# Patient Record
Sex: Female | Born: 1950 | Race: White | Hispanic: No | Marital: Single | Smoking: Never smoker
Health system: Southern US, Community
[De-identification: ages and names within clinical notes are randomized; demographics above are authoritative.]

## PROBLEM LIST (undated history)

## (undated) DIAGNOSIS — M1990 Unspecified osteoarthritis, unspecified site: Secondary | ICD-10-CM

## (undated) DIAGNOSIS — K219 Gastro-esophageal reflux disease without esophagitis: Secondary | ICD-10-CM

## (undated) DIAGNOSIS — I499 Cardiac arrhythmia, unspecified: Secondary | ICD-10-CM

## (undated) DIAGNOSIS — N3281 Overactive bladder: Secondary | ICD-10-CM

## (undated) DIAGNOSIS — I10 Essential (primary) hypertension: Secondary | ICD-10-CM

## (undated) DIAGNOSIS — E785 Hyperlipidemia, unspecified: Secondary | ICD-10-CM

## (undated) DIAGNOSIS — R42 Dizziness and giddiness: Secondary | ICD-10-CM

## (undated) HISTORY — DX: Overactive bladder: N32.81

---

## 2014-07-14 ENCOUNTER — Emergency Department: Payer: Self-pay | Admitting: Emergency Medicine

## 2014-07-14 LAB — BASIC METABOLIC PANEL
Anion Gap: 8 (ref 7–16)
BUN: 14 mg/dL (ref 7–18)
CHLORIDE: 108 mmol/L — AB (ref 98–107)
CO2: 24 mmol/L (ref 21–32)
Calcium, Total: 8.4 mg/dL — ABNORMAL LOW (ref 8.5–10.1)
Creatinine: 0.86 mg/dL (ref 0.60–1.30)
EGFR (African American): >60
GLUCOSE: 139 mg/dL — AB (ref 65–99)
Osmolality: 282 (ref 275–301)
POTASSIUM: 3.9 mmol/L (ref 3.5–5.1)
Sodium: 140 mmol/L (ref 136–145)

## 2014-07-14 LAB — CBC WITH DIFFERENTIAL/PLATELET
BASOS PCT: 0.8 %
Basophil #: 0 10*3/uL (ref 0.0–0.1)
Eosinophil #: 0.1 10*3/uL (ref 0.0–0.7)
Eosinophil %: 2.5 %
HCT: 39.6 % (ref 35.0–47.0)
HGB: 13 g/dL (ref 12.0–16.0)
Lymphocyte #: 1.1 10*3/uL (ref 1.0–3.6)
Lymphocyte %: 21.1 %
MCH: 30.5 pg (ref 26.0–34.0)
MCHC: 32.7 g/dL (ref 32.0–36.0)
MCV: 93 fL (ref 80–100)
MONO ABS: 0.3 x10 3/mm (ref 0.2–0.9)
MONOS PCT: 6.2 %
Neutrophil #: 3.5 10*3/uL (ref 1.4–6.5)
Neutrophil %: 69.4 %
Platelet: 178 10*3/uL (ref 150–440)
RBC: 4.25 10*6/uL (ref 3.80–5.20)
RDW: 13.1 % (ref 11.5–14.5)
WBC: 5 10*3/uL (ref 3.6–11.0)

## 2014-07-14 LAB — TROPONIN I: Troponin-I: <0.02 ng/mL

## 2014-07-14 LAB — URINALYSIS, COMPLETE
Bilirubin,UR: NEGATIVE
Blood: NEGATIVE
Glucose,UR: NEGATIVE mg/dL (ref 0–75)
Ketone: NEGATIVE
Nitrite: NEGATIVE
Ph: 5 (ref 4.5–8.0)
Protein: NEGATIVE
RBC,UR: 2 /HPF (ref 0–5)
SPECIFIC GRAVITY: 1.016 (ref 1.003–1.030)

## 2014-09-05 ENCOUNTER — Emergency Department: Payer: Self-pay | Admitting: Student

## 2014-09-05 LAB — HEPATIC FUNCTION PANEL A (ARMC)
ALT: 23 U/L
Albumin: 3.8 g/dL (ref 3.4–5.0)
Alkaline Phosphatase: 92 U/L
BILIRUBIN TOTAL: 0.4 mg/dL (ref 0.2–1.0)
Bilirubin, Direct: <0.1 mg/dL (ref 0.0–0.2)
SGOT(AST): 23 U/L (ref 15–37)
Total Protein: 7.3 g/dL (ref 6.4–8.2)

## 2014-09-05 LAB — BASIC METABOLIC PANEL
ANION GAP: 9 (ref 7–16)
BUN: 15 mg/dL (ref 7–18)
CALCIUM: 8.6 mg/dL (ref 8.5–10.1)
Chloride: 106 mmol/L (ref 98–107)
Co2: 25 mmol/L (ref 21–32)
Creatinine: 0.83 mg/dL (ref 0.60–1.30)
EGFR (African American): >60
Glucose: 103 mg/dL — ABNORMAL HIGH (ref 65–99)
OSMOLALITY: 280 (ref 275–301)
POTASSIUM: 4 mmol/L (ref 3.5–5.1)
Sodium: 140 mmol/L (ref 136–145)

## 2014-09-05 LAB — TROPONIN I: Troponin-I: <0.02 ng/mL

## 2014-09-05 LAB — CBC
HCT: 42 % (ref 35.0–47.0)
HGB: 14.6 g/dL (ref 12.0–16.0)
MCH: 31.6 pg (ref 26.0–34.0)
MCHC: 34.6 g/dL (ref 32.0–36.0)
MCV: 91 fL (ref 80–100)
Platelet: 206 10*3/uL (ref 150–440)
RBC: 4.61 10*6/uL (ref 3.80–5.20)
RDW: 12.9 % (ref 11.5–14.5)
WBC: 6.1 10*3/uL (ref 3.6–11.0)

## 2014-09-05 LAB — PRO B NATRIURETIC PEPTIDE: B-Type Natriuretic Peptide: 98 pg/mL (ref 0–125)

## 2014-10-04 DIAGNOSIS — H8110 Benign paroxysmal vertigo, unspecified ear: Secondary | ICD-10-CM | POA: Diagnosis not present

## 2014-10-05 DIAGNOSIS — M1711 Unilateral primary osteoarthritis, right knee: Secondary | ICD-10-CM | POA: Diagnosis not present

## 2014-10-11 DIAGNOSIS — G5752 Tarsal tunnel syndrome, left lower limb: Secondary | ICD-10-CM | POA: Diagnosis not present

## 2014-10-25 DIAGNOSIS — R5382 Chronic fatigue, unspecified: Secondary | ICD-10-CM | POA: Diagnosis not present

## 2014-10-25 DIAGNOSIS — R42 Dizziness and giddiness: Secondary | ICD-10-CM | POA: Diagnosis not present

## 2014-10-25 DIAGNOSIS — I10 Essential (primary) hypertension: Secondary | ICD-10-CM | POA: Diagnosis not present

## 2014-11-26 DIAGNOSIS — H9319 Tinnitus, unspecified ear: Secondary | ICD-10-CM | POA: Diagnosis not present

## 2014-11-26 DIAGNOSIS — H8109 Meniere's disease, unspecified ear: Secondary | ICD-10-CM | POA: Diagnosis not present

## 2014-12-10 DIAGNOSIS — H903 Sensorineural hearing loss, bilateral: Secondary | ICD-10-CM | POA: Diagnosis not present

## 2014-12-10 DIAGNOSIS — H8111 Benign paroxysmal vertigo, right ear: Secondary | ICD-10-CM | POA: Diagnosis not present

## 2014-12-13 DIAGNOSIS — H8111 Benign paroxysmal vertigo, right ear: Secondary | ICD-10-CM | POA: Diagnosis not present

## 2014-12-20 DIAGNOSIS — K120 Recurrent oral aphthae: Secondary | ICD-10-CM | POA: Diagnosis not present

## 2014-12-20 DIAGNOSIS — H8110 Benign paroxysmal vertigo, unspecified ear: Secondary | ICD-10-CM | POA: Diagnosis not present

## 2014-12-21 DIAGNOSIS — H8110 Benign paroxysmal vertigo, unspecified ear: Secondary | ICD-10-CM | POA: Diagnosis not present

## 2014-12-21 DIAGNOSIS — H9193 Unspecified hearing loss, bilateral: Secondary | ICD-10-CM | POA: Diagnosis not present

## 2014-12-21 DIAGNOSIS — H8109 Meniere's disease, unspecified ear: Secondary | ICD-10-CM | POA: Diagnosis not present

## 2014-12-30 DIAGNOSIS — H8111 Benign paroxysmal vertigo, right ear: Secondary | ICD-10-CM | POA: Diagnosis not present

## 2015-01-17 DIAGNOSIS — H8109 Meniere's disease, unspecified ear: Secondary | ICD-10-CM | POA: Diagnosis not present

## 2015-01-17 DIAGNOSIS — G44219 Episodic tension-type headache, not intractable: Secondary | ICD-10-CM | POA: Diagnosis not present

## 2015-01-17 DIAGNOSIS — H8110 Benign paroxysmal vertigo, unspecified ear: Secondary | ICD-10-CM | POA: Diagnosis not present

## 2015-02-20 ENCOUNTER — Emergency Department: Payer: No Typology Code available for payment source

## 2015-02-20 ENCOUNTER — Emergency Department
Admission: EM | Admit: 2015-02-20 | Discharge: 2015-02-20 | Disposition: A | Discharge status: Home / Self Care | Payer: No Typology Code available for payment source | Attending: Emergency Medicine | Admitting: Emergency Medicine

## 2015-02-20 ENCOUNTER — Encounter: Payer: Self-pay | Admitting: Emergency Medicine

## 2015-02-20 DIAGNOSIS — S299XXA Unspecified injury of thorax, initial encounter: Secondary | ICD-10-CM | POA: Diagnosis not present

## 2015-02-20 DIAGNOSIS — I10 Essential (primary) hypertension: Secondary | ICD-10-CM | POA: Insufficient documentation

## 2015-02-20 DIAGNOSIS — Y998 Other external cause status: Secondary | ICD-10-CM | POA: Insufficient documentation

## 2015-02-20 DIAGNOSIS — Y9389 Activity, other specified: Secondary | ICD-10-CM | POA: Diagnosis not present

## 2015-02-20 DIAGNOSIS — S3992XA Unspecified injury of lower back, initial encounter: Secondary | ICD-10-CM | POA: Insufficient documentation

## 2015-02-20 DIAGNOSIS — R079 Chest pain, unspecified: Secondary | ICD-10-CM | POA: Diagnosis not present

## 2015-02-20 DIAGNOSIS — R0789 Other chest pain: Secondary | ICD-10-CM | POA: Diagnosis not present

## 2015-02-20 DIAGNOSIS — Y92410 Unspecified street and highway as the place of occurrence of the external cause: Secondary | ICD-10-CM | POA: Diagnosis not present

## 2015-02-20 MED ORDER — TRAMADOL HCL 50 MG PO TABS: 50.0000 mg | ORAL_TABLET | Freq: Four times a day (QID) | ORAL | Status: DC | PRN

## 2015-02-20 MED ORDER — ORPHENADRINE CITRATE ER 100 MG PO TB12: 100.0000 mg | ORAL_TABLET | Freq: Two times a day (BID) | ORAL | Status: DC | PRN
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
## 2015-02-20 NOTE — Discharge Instructions (Signed)

## 2015-02-23 DIAGNOSIS — R05 Cough: Secondary | ICD-10-CM | POA: Diagnosis not present

## 2015-03-16 DIAGNOSIS — R0781 Pleurodynia: Secondary | ICD-10-CM | POA: Diagnosis not present

## 2015-05-23 DIAGNOSIS — M1712 Unilateral primary osteoarthritis, left knee: Secondary | ICD-10-CM | POA: Diagnosis not present

## 2015-05-23 DIAGNOSIS — R05 Cough: Secondary | ICD-10-CM | POA: Diagnosis not present

## 2015-05-23 DIAGNOSIS — K122 Cellulitis and abscess of mouth: Secondary | ICD-10-CM | POA: Diagnosis not present

## 2015-06-22 ENCOUNTER — Other Ambulatory Visit: Payer: Self-pay | Admitting: Family Medicine

## 2015-06-22 DIAGNOSIS — I10 Essential (primary) hypertension: Secondary | ICD-10-CM | POA: Diagnosis not present

## 2015-06-22 DIAGNOSIS — Z1231 Encounter for screening mammogram for malignant neoplasm of breast: Secondary | ICD-10-CM | POA: Diagnosis not present

## 2015-06-22 DIAGNOSIS — R05 Cough: Secondary | ICD-10-CM | POA: Diagnosis not present

## 2015-06-27 DIAGNOSIS — R05 Cough: Secondary | ICD-10-CM | POA: Diagnosis not present

## 2015-06-30 ENCOUNTER — Ambulatory Visit
Admission: RE | Admit: 2015-06-30 | Discharge: 2015-06-30 | Disposition: A | Discharge status: Home / Self Care | Payer: Commercial Managed Care - HMO | Source: Ambulatory Visit | Attending: Family Medicine | Admitting: Family Medicine

## 2015-06-30 DIAGNOSIS — Z1231 Encounter for screening mammogram for malignant neoplasm of breast: Secondary | ICD-10-CM | POA: Diagnosis not present

## 2015-08-31 DIAGNOSIS — J0100 Acute maxillary sinusitis, unspecified: Secondary | ICD-10-CM | POA: Diagnosis not present

## 2015-12-21 DIAGNOSIS — M7752 Other enthesopathy of left foot: Secondary | ICD-10-CM | POA: Diagnosis not present

## 2015-12-21 DIAGNOSIS — N8110 Cystocele, unspecified: Secondary | ICD-10-CM | POA: Diagnosis not present

## 2015-12-21 DIAGNOSIS — I10 Essential (primary) hypertension: Secondary | ICD-10-CM | POA: Diagnosis not present

## 2015-12-28 DIAGNOSIS — M7662 Achilles tendinitis, left leg: Secondary | ICD-10-CM | POA: Diagnosis not present

## 2016-01-16 DIAGNOSIS — N8110 Cystocele, unspecified: Secondary | ICD-10-CM | POA: Diagnosis not present

## 2016-01-16 DIAGNOSIS — Z139 Encounter for screening, unspecified: Secondary | ICD-10-CM | POA: Diagnosis not present

## 2016-01-16 DIAGNOSIS — Z124 Encounter for screening for malignant neoplasm of cervix: Secondary | ICD-10-CM | POA: Diagnosis not present

## 2016-01-30 DIAGNOSIS — M7662 Achilles tendinitis, left leg: Secondary | ICD-10-CM | POA: Diagnosis not present

## 2016-03-05 DIAGNOSIS — N814 Uterovaginal prolapse, unspecified: Secondary | ICD-10-CM | POA: Diagnosis not present

## 2016-04-06 DIAGNOSIS — N814 Uterovaginal prolapse, unspecified: Secondary | ICD-10-CM | POA: Diagnosis not present

## 2016-06-27 DIAGNOSIS — I10 Essential (primary) hypertension: Secondary | ICD-10-CM | POA: Diagnosis not present

## 2016-06-27 DIAGNOSIS — Z1159 Encounter for screening for other viral diseases: Secondary | ICD-10-CM | POA: Diagnosis not present

## 2016-06-27 DIAGNOSIS — N3281 Overactive bladder: Secondary | ICD-10-CM | POA: Diagnosis not present

## 2016-06-27 DIAGNOSIS — Z0000 Encounter for general adult medical examination without abnormal findings: Secondary | ICD-10-CM | POA: Diagnosis not present

## 2016-06-27 DIAGNOSIS — M159 Polyosteoarthritis, unspecified: Secondary | ICD-10-CM | POA: Diagnosis not present

## 2016-07-30 DIAGNOSIS — R42 Dizziness and giddiness: Secondary | ICD-10-CM | POA: Diagnosis not present

## 2016-07-30 DIAGNOSIS — R432 Parageusia: Secondary | ICD-10-CM | POA: Diagnosis not present

## 2016-08-03 DIAGNOSIS — F458 Other somatoform disorders: Secondary | ICD-10-CM | POA: Diagnosis not present

## 2016-08-17 ENCOUNTER — Ambulatory Visit: Payer: Self-pay

## 2016-08-17 ENCOUNTER — Ambulatory Visit (INDEPENDENT_AMBULATORY_CARE_PROVIDER_SITE_OTHER): Payer: Commercial Managed Care - HMO | Admitting: Podiatry

## 2016-08-17 ENCOUNTER — Ambulatory Visit (INDEPENDENT_AMBULATORY_CARE_PROVIDER_SITE_OTHER): Payer: Commercial Managed Care - HMO

## 2016-08-17 VITALS — BP 146/90 | HR 69 | Temp 98.2°F | Resp 16 | Ht 59.0 in | Wt 147.0 lb

## 2016-08-17 DIAGNOSIS — R52 Pain, unspecified: Secondary | ICD-10-CM | POA: Diagnosis not present

## 2016-08-17 DIAGNOSIS — M79671 Pain in right foot: Secondary | ICD-10-CM

## 2016-08-17 DIAGNOSIS — M7661 Achilles tendinitis, right leg: Secondary | ICD-10-CM

## 2016-08-17 DIAGNOSIS — M722 Plantar fascial fibromatosis: Secondary | ICD-10-CM | POA: Diagnosis not present

## 2016-08-17 MED ORDER — NONFORMULARY OR COMPOUNDED ITEM: 1.0000 g | Freq: Four times a day (QID) | 2 refills | Status: DC

## 2016-09-09 MED ORDER — BETAMETHASONE SOD PHOS & ACET 6 (3-3) MG/ML IJ SUSP: 3.0000 mg | Freq: Once | INTRAMUSCULAR | Status: AC

## 2016-09-09 NOTE — Progress Notes (Signed)
Subjective: Patient presents today for pain and tenderness in the right foot. Patient states the foot pain has been hurting for several weeks now. Patient states that it hurts in the mornings with the first steps out of bed. Patient states that she's been having this heel pain for the past 3 years. Walking makes the pain worse. Patient has been applying Aspercreme which she states does help a little bit. Patient presents today for further treatment and evaluation  Objective: Physical Exam General: The patient is alert and oriented x3 in no acute distress.  Dermatology: Skin is warm, dry and supple bilateral lower extremities. Negative for open lesions or macerations bilateral.   Vascular: Dorsalis Pedis and Posterior Tibial pulses palpable bilateral.  Capillary fill time is immediate to all digits.  Neurological: Epicritic and protective threshold intact bilateral.   Musculoskeletal: Tenderness to palpation at the medial calcaneal tubercale and through the insertion of the plantar fascia of the right foot. All other joints range of motion within normal limits bilateral. Strength 5/5 in all groups bilateral.   Pain on palpation also noted to the insertion of the Achilles tendon right foot. Negative for pain on forced plantar flexion. Achilles tendon appears to be intact.  Radiographic exam: Normal osseous mineralization. Joint spaces preserved. No fracture/dislocation/boney destruction. Calcaneal spur present with mild thickening of plantar fascia right. No other soft tissue abnormalities or radiopaque foreign bodies.   Assessment: 1. Plantar fasciitis right 2. Pain in right foot 3. Insertional Achilles tendinitis right  Plan of Care:  1. Patient evaluated. Xrays reviewed.   2. Injection of 0.5cc Celestone soluspan injected into the right heel at the insertion of the plantar fascia.  3. Instructed patient regarding therapies and modalities at home to alleviate symptoms.  4. Rx for  anti-inflammatory pain cream dispensed through [employer]. 6. Compression anklet dispensed with silicone padding of the posterior heel 7. Return to clinic in 4 weeks.

## 2016-09-14 ENCOUNTER — Ambulatory Visit: Payer: Self-pay | Admitting: Podiatry

## 2016-10-08 DIAGNOSIS — K219 Gastro-esophageal reflux disease without esophagitis: Secondary | ICD-10-CM | POA: Diagnosis not present

## 2016-10-08 DIAGNOSIS — F458 Other somatoform disorders: Secondary | ICD-10-CM | POA: Diagnosis not present

## 2016-10-26 DIAGNOSIS — M1711 Unilateral primary osteoarthritis, right knee: Secondary | ICD-10-CM | POA: Diagnosis not present

## 2016-11-13 DIAGNOSIS — R6889 Other general symptoms and signs: Secondary | ICD-10-CM | POA: Diagnosis not present

## 2017-01-25 DIAGNOSIS — E7800 Pure hypercholesterolemia, unspecified: Secondary | ICD-10-CM | POA: Diagnosis not present

## 2017-01-25 DIAGNOSIS — M159 Polyosteoarthritis, unspecified: Secondary | ICD-10-CM | POA: Diagnosis not present

## 2017-01-25 DIAGNOSIS — N3281 Overactive bladder: Secondary | ICD-10-CM | POA: Diagnosis not present

## 2017-01-25 DIAGNOSIS — K219 Gastro-esophageal reflux disease without esophagitis: Secondary | ICD-10-CM | POA: Diagnosis not present

## 2017-01-25 DIAGNOSIS — Z79891 Long term (current) use of opiate analgesic: Secondary | ICD-10-CM | POA: Diagnosis not present

## 2017-01-25 DIAGNOSIS — I10 Essential (primary) hypertension: Secondary | ICD-10-CM | POA: Diagnosis not present

## 2017-02-15 ENCOUNTER — Ambulatory Visit: Payer: Self-pay | Admitting: Urology

## 2017-03-22 ENCOUNTER — Encounter: Payer: Self-pay | Admitting: Urology

## 2017-03-22 ENCOUNTER — Ambulatory Visit (INDEPENDENT_AMBULATORY_CARE_PROVIDER_SITE_OTHER): Payer: Medicare HMO | Admitting: Urology

## 2017-03-22 VITALS — BP 147/87 | HR 70 | Ht 59.0 in | Wt 147.0 lb

## 2017-03-22 DIAGNOSIS — N3281 Overactive bladder: Secondary | ICD-10-CM

## 2017-03-22 LAB — BLADDER SCAN AMB NON-IMAGING: SCAN RESULT: 37

## 2017-03-22 NOTE — Progress Notes (Signed)
Patient left without wanting to see provider. States she was "referred for the wrong damn thing, she needs a pacy". States she is going back to her doctor.

## 2017-03-22 NOTE — Progress Notes (Signed)
    03/22/2017 8:25 AM   Kaitlin Wilson 01-21-1951 [id number]  Referring provider: Sherrin Daisy, MD No address on file  No chief complaint on file.   HPI: Patient is a 66 -year-old Caucasian female who is referred to [place] by, Dr. Ellison Hughs, for urinary incontinence.  Patient left without being seen by me.   These notes generated with voice recognition software. I apologize for typographical errors.  [name], [facility] [address], [place], [place] [address] [phone]

## 2017-04-16 DIAGNOSIS — R351 Nocturia: Secondary | ICD-10-CM | POA: Diagnosis not present

## 2017-04-16 DIAGNOSIS — N816 Rectocele: Secondary | ICD-10-CM | POA: Diagnosis not present

## 2017-04-16 DIAGNOSIS — N3281 Overactive bladder: Secondary | ICD-10-CM | POA: Diagnosis not present

## 2017-04-16 DIAGNOSIS — N813 Complete uterovaginal prolapse: Secondary | ICD-10-CM | POA: Diagnosis not present

## 2017-04-17 DIAGNOSIS — J029 Acute pharyngitis, unspecified: Secondary | ICD-10-CM | POA: Diagnosis not present

## 2017-05-09 DIAGNOSIS — N814 Uterovaginal prolapse, unspecified: Secondary | ICD-10-CM | POA: Diagnosis not present

## 2017-05-12 ENCOUNTER — Ambulatory Visit
Admission: EM | Admit: 2017-05-12 | Discharge: 2017-05-12 | Disposition: A | Discharge status: Home / Self Care | Payer: Worker's Compensation | Attending: Family Medicine | Admitting: Family Medicine

## 2017-05-12 ENCOUNTER — Encounter: Payer: Self-pay | Admitting: Gynecology

## 2017-05-12 ENCOUNTER — Ambulatory Visit (INDEPENDENT_AMBULATORY_CARE_PROVIDER_SITE_OTHER): Payer: Worker's Compensation

## 2017-05-12 DIAGNOSIS — W19XXXA Unspecified fall, initial encounter: Secondary | ICD-10-CM | POA: Diagnosis not present

## 2017-05-12 DIAGNOSIS — M25532 Pain in left wrist: Secondary | ICD-10-CM | POA: Diagnosis not present

## 2017-05-12 DIAGNOSIS — M79642 Pain in left hand: Secondary | ICD-10-CM

## 2017-05-12 HISTORY — DX: Gastro-esophageal reflux disease without esophagitis: K21.9

## 2017-05-12 HISTORY — DX: Essential (primary) hypertension: I10

## 2017-05-12 NOTE — ED Provider Notes (Signed)
[facility]    CSN: [id number] Arrival date & time: 05/12/17  0808  History   Chief Complaint Chief Complaint  Patient presents with  . Hand Pain   HPI  66 year old female presents with complaints of hand/wrist pain.  Patient states that yesterday she was at work and tripped on some cords. She states that she fell forward onto an outstretched left hand. She immediately had pain and swelling of the hand and more predominantly wrist. She reports that she has continued to have significant pain and swelling. Decreased range of motion of the wrist. Pain is currently 10/10 in severity. She took 1 tramadol with no improvement. She's also used ice and heat with some improvement. Worse with palpation and range of motion. No reports of numbness or tingling. No other associated symptoms. No other complaints this time.  Past Medical History:  Diagnosis Date  . GERD (gastroesophageal reflux disease)   . Hypertension    Patient Active Problem List   Diagnosis Date Noted  . Hypertension 02/20/2015   History reviewed. No pertinent surgical history.  OB History    No data available     Home Medications    Prior to Admission medications   Medication Sig Start Date End Date Taking? Authorizing Provider  Cholecalciferol (VITAMIN D) 2000 units tablet Take by mouth.   Yes [provider]  losartan (COZAAR) 25 MG tablet TAKE 1 TABLET (25 MG TOTAL) BY MOUTH ONCE DAILY. 12/23/15  Yes [provider]  meloxicam (MOBIC) 7.5 MG tablet  01/12/16  Yes [provider]  NONFORMULARY OR COMPOUNDED ITEM Apply 1-2 g topically 4 (four) times daily. 08/17/16  Yes Edrick Kins, DPM  omeprazole (PRILOSEC) 20 MG capsule Take by mouth. 10/15/16  Yes [provider]  traMADol (ULTRAM) 50 MG tablet Take 1 tablet (50 mg total) by mouth every 6 (six) hours as needed. 02/20/15  Yes Triplett, Dessa Phi, FNP   Family History Family History  Problem Relation Age of Onset  .  Stroke Father   . Kidney cancer Neg Hx   . Bladder Cancer Neg Hx    Social History Social History  Substance Use Topics  . Smoking status: Never Smoker  . Smokeless tobacco: Never Used  . Alcohol use No   Allergies   Patient has no known allergies.   Review of Systems Review of Systems  Musculoskeletal:       Wrist/hand pain.  All other systems reviewed and are negative.  Physical Exam Triage Vital Signs ED Triage Vitals  Enc Vitals Group     BP 05/12/17 0827 (!) 149/72     Pulse Rate 05/12/17 0827 70     Resp 05/12/17 0827 16     Temp 05/12/17 0827 97.9 F (36.6 C)     Temp Source 05/12/17 0827 Oral     SpO2 05/12/17 0827 100 %     Weight 05/12/17 0828 145 lb (65.8 kg)     Height 05/12/17 0828 4\' 11"  (1.499 m)     Head Circumference --      Peak Flow --      Pain Score 05/12/17 0828 10     Pain Loc --      Pain Edu? --      Excl. in [place]? --    Updated Vital Signs BP (!) 149/72 (BP Location: Right Arm)   Pulse 70   Temp 97.9 F (36.6 C) (Oral)   Resp 16   Ht 4\' 11"  (1.499  m)   Wt 145 lb (65.8 kg)   SpO2 100%   BMI 29.29 kg/m   Physical Exam  Constitutional: She is oriented to person, place, and time. She appears well-developed. No distress.  HENT:  Head: Normocephalic and atraumatic.  Nose: Nose normal.  Eyes: Conjunctivae are normal. No scleral icterus.  Neck: Normal range of motion.  Cardiovascular: Normal rate and regular rhythm.   No murmur heard. Pulmonary/Chest: Effort normal and breath sounds normal.  Abdominal: Soft. She exhibits no distension. There is no tenderness.  Musculoskeletal:  Left hand and wrist - normal range of motion of the digits, nontender. No pain of the MCP joints. Patient has diffuse tenderness to palpation of the wrist. Has snuffbox tenderness as well. Decreased range of motion. Mild edema.  Neurological: She is alert and oriented to person, place, and time.  Skin: Skin is warm. No rash noted.  Psychiatric: She has a  normal mood and affect.  Vitals reviewed.  UC Treatments / Results  Labs (all labs ordered are listed, but only abnormal results are displayed) Labs Reviewed - No data to display  EKG  EKG Interpretation None       Radiology Dg Wrist Complete Left  Result Date: 05/12/2017 CLINICAL DATA:  Posterior LEFT wrist pain after falling on outstretched hand yesterday EXAM: LEFT WRIST - COMPLETE 3+ VIEW COMPARISON:  None FINDINGS: Diffuse osseous demineralization. Mild joint space narrowing at first [facility] joint. Joint spaces otherwise preserved. No acute fracture, dislocation, or bone destruction. Mild dorsal soft tissue swelling overlying carpus. IMPRESSION: Osseous demineralization with degenerative changes at first [facility] joint. No acute fracture or dislocation identified. Electronically Signed   By: Lavonia Dana M.D.   On: 05/12/2017 09:08   Procedures Procedures (including critical care time)  Medications Ordered in UC Medications - No data to display  Initial Impression / Assessment and Plan / UC Course  I have reviewed the triage vital signs and the nursing notes.  Pertinent labs & imaging results that were available during my care of the patient were reviewed by me and considered in my medical decision making (see chart for details).    66 year old female present with with complaints of left hand and wrist pain. X-ray negative for fracture. Advise continue use of her home meloxicam and tramadol. Work note provided.  Final Clinical Impressions(s) / UC Diagnoses   Final diagnoses:  Left wrist pain   New Prescriptions New Prescriptions   No medications on file   Controlled Substance Prescriptions [place] Controlled Substance Registry consulted? Not Applicable; Did not prescribe.   Thersa Salt Donnel[place], [place] 05/12/17 [phone]

## 2017-05-12 NOTE — ED Triage Notes (Signed)
Per patient fell at work x yesterday and injury left hand. Patient with pain and swollen left hand.

## 2017-05-12 NOTE — Discharge Instructions (Signed)
No fracture.  Use your Mobic and Tramadol as needed.   If pain persists and no improvement would recommend xray again.  Take care  Dr. Lacinda Axon

## 2017-05-14 DIAGNOSIS — N390 Urinary tract infection, site not specified: Secondary | ICD-10-CM | POA: Diagnosis not present

## 2017-05-14 DIAGNOSIS — R300 Dysuria: Secondary | ICD-10-CM | POA: Diagnosis not present

## 2017-05-20 ENCOUNTER — Ambulatory Visit: Payer: Commercial Managed Care - HMO | Admitting: Podiatry

## 2017-05-20 DIAGNOSIS — R0781 Pleurodynia: Secondary | ICD-10-CM | POA: Diagnosis not present

## 2017-05-20 DIAGNOSIS — R0789 Other chest pain: Secondary | ICD-10-CM | POA: Diagnosis not present

## 2017-05-31 ENCOUNTER — Ambulatory Visit (INDEPENDENT_AMBULATORY_CARE_PROVIDER_SITE_OTHER): Payer: Medicare HMO

## 2017-05-31 ENCOUNTER — Ambulatory Visit (INDEPENDENT_AMBULATORY_CARE_PROVIDER_SITE_OTHER): Payer: Medicare HMO | Admitting: Podiatry

## 2017-05-31 DIAGNOSIS — M659 Synovitis and tenosynovitis, unspecified: Secondary | ICD-10-CM

## 2017-05-31 DIAGNOSIS — M79672 Pain in left foot: Secondary | ICD-10-CM | POA: Diagnosis not present

## 2017-06-08 MED ORDER — BETAMETHASONE SOD PHOS & ACET 6 (3-3) MG/ML IJ SUSP: 3.0000 mg | Freq: Once | INTRAMUSCULAR | Status: DC

## 2017-06-08 NOTE — Progress Notes (Signed)
   Subjective:  66 year old female presents the office today for a new complaint regarding pain to the top of her left foot. This pain is been ongoing for the past month. Patient sustained an injury when she fell on top of her foot approximately one month ago and she is currently having shooting aching pain area aggravated by standing and walking. Alleviated by ice Epsom salt soaks and Aspercreme.  Objective / Physical Exam:  General:  The patient is alert and oriented x3 in no acute distress. Dermatology:  Skin is warm, dry and supple bilateral lower extremities. Negative for open lesions or macerations. Vascular:  Palpable pedal pulses bilaterally. No edema or erythema noted. Capillary refill within normal limits. Neurological:  Epicritic and protective threshold grossly intact bilaterally.  Musculoskeletal Exam:  Pain on palpation to the anterior lateral medial aspects of the patient's left ankle. Mild edema noted.  Range of motion within normal limits to all pedal and ankle joints bilateral. Muscle strength 5/5 in all groups bilateral.   Radiographic Exam:  Normal osseous mineralization. Joint spaces preserved. No fracture/dislocation/boney destruction.    Assessment: #1 pain in left ankle #2 synovitis of left ankle #3 capsulitis of left ankle  Plan of Care:  #1 Patient was evaluated. #2 injection of 0.5 mL Celestone Soluspan injected in the patient's left ankle. #3 the patient is currently taking Aleve. Continue taking Aleve when necessary pain #4 ankle brace dispensed #5 patient is to return to clinic in 4 weeks   Edrick Kins, DPM [facility]  Dr. Edrick Kins, [address]                                        [place], [place] [address]                Office [phone]  Fax [phone]

## 2017-06-12 DIAGNOSIS — N8111 Cystocele, midline: Secondary | ICD-10-CM | POA: Diagnosis not present

## 2017-06-12 DIAGNOSIS — N814 Uterovaginal prolapse, unspecified: Secondary | ICD-10-CM | POA: Diagnosis not present

## 2017-06-12 DIAGNOSIS — K5904 Chronic idiopathic constipation: Secondary | ICD-10-CM | POA: Diagnosis not present

## 2017-06-28 ENCOUNTER — Ambulatory Visit (INDEPENDENT_AMBULATORY_CARE_PROVIDER_SITE_OTHER): Payer: Medicare HMO | Admitting: Podiatry

## 2017-06-28 DIAGNOSIS — R600 Localized edema: Secondary | ICD-10-CM | POA: Diagnosis not present

## 2017-06-28 NOTE — Progress Notes (Signed)
   Subjective:  Patient presents today for follow-up evaluation and treatment of left ankle pain with synovitis. Patient actually has a new complaint today regarding the left lower extremity edema. She noticed that over the past few weeks she has developed a significant amount of swelling to her foot. Patient believes the injection she received last visit helped significantly.  Objective / Physical Exam:  General:  The patient is alert and oriented x3 in no acute distress. Dermatology:  Skin is warm, dry and supple bilateral lower extremities. Negative for open lesions or macerations. Vascular:  Palpable pedal pulses bilaterally. There is some minimal edema noted to the left foot.. Capillary refill within normal limits. Neurological:  Epicritic and protective threshold grossly intact bilaterally.  Musculoskeletal Exam:  Negative for Pain on palpation to the anterior lateral medial aspects of the patient's left ankle. Mild edema noted.  Range of motion within normal limits to all pedal and ankle joints bilateral. Muscle strength 5/5 in all groups bilateral.   Radiographic Exam:  Normal osseous mineralization. Joint spaces preserved. No fracture/dislocation/boney destruction.    Assessment: #1 pain in left ankle #2 left foot edema-mild  Plan of Care:  #1 Patient was evaluated. #2 today a compression ankle sleeve was dispensed for the patient. 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 Edrick Kins, [address]                                        [place], [place] [address]                Office [phone]  Fax [phone]

## 2017-07-14 ENCOUNTER — Encounter: Payer: Self-pay | Admitting: Emergency Medicine

## 2017-07-14 ENCOUNTER — Ambulatory Visit
Admission: EM | Admit: 2017-07-14 | Discharge: 2017-07-14 | Disposition: A | Discharge status: Home / Self Care | Payer: Medicare HMO

## 2017-07-14 DIAGNOSIS — R21 Rash and other nonspecific skin eruption: Secondary | ICD-10-CM

## 2017-07-14 DIAGNOSIS — L959 Vasculitis limited to the skin, unspecified: Secondary | ICD-10-CM | POA: Diagnosis not present

## 2017-07-14 NOTE — Discharge Instructions (Signed)
-  Would refrain from wearing the compression stocking any more. -Should improve on its own, if not better in about a week can follow up with dermatology  -you can take Advil or Tylenol for pain

## 2017-07-14 NOTE — ED Triage Notes (Signed)
Patient c/o red tingling rash on her left lower leg for the past 2 weeks.  Patient states that she wore a compression sock on her left leg 2 weeks ago.

## 2017-07-14 NOTE — ED Provider Notes (Signed)
[facility]    CSN: [id number] Arrival date & time: 07/14/17  1312     History   Chief Complaint Chief Complaint  Patient presents with  . Rash    HPI Kaitlin Wilson is a 66 y.o. female.   Patient is a 66 year old female who presents with complaint of rash to her lower left leg. Patient states she was seen by a podiatrist for ankle pain. She was initially seen August 31 given an injection and given a lace up ankle brace. She returned on September 28 and was noted to have ankle edema. The injection apparently helped with her pain. Patient states she was told that that she walks so much with work that she would need extra support for her ankle in the podiatrist gave her a slide on compression socking. Patient reports that the stocking is very tight and came up to just where the top of the rash is. Patient denies any itching or pain to the rash itself but does report some pain sensation upper leg.      Past Medical History:  Diagnosis Date  . GERD (gastroesophageal reflux disease)   . Hypertension     Patient Active Problem List   Diagnosis Date Noted  . Hypertension 02/20/2015    History reviewed. No pertinent surgical history.  OB History    No data available       Home Medications    Prior to Admission medications   Medication Sig Start Date End Date Taking? Authorizing Provider  Cholecalciferol (VITAMIN D) 2000 units tablet Take by mouth.    [provider]  losartan (COZAAR) 25 MG tablet TAKE 1 TABLET (25 MG TOTAL) BY MOUTH ONCE DAILY. 12/23/15   [provider]  NONFORMULARY OR COMPOUNDED ITEM Apply 1-2 g topically 4 (four) times daily. 08/17/16   Edrick Kins, DPM  omeprazole (PRILOSEC) 20 MG capsule Take by mouth. 10/15/16   [provider]    Family History Family History  Problem Relation Age of Onset  . Stroke Father   . Kidney cancer Neg Hx   . Bladder Cancer Neg Hx     Social History Social History    Substance Use Topics  . Smoking status: Never Smoker  . Smokeless tobacco: Never Used  . Alcohol use No     Allergies   Patient has no known allergies.   Review of Systems Review of Systems  As noted above in history of present illness. Other systems reviewed and found to be negative.   Physical Exam Triage Vital Signs ED Triage Vitals  Enc Vitals Group     BP 07/14/17 1420 (!) 155/73     Pulse Rate 07/14/17 1420 69     Resp 07/14/17 1420 16     Temp 07/14/17 1420 98.1 F (36.7 C)     Temp Source 07/14/17 1420 Oral     SpO2 07/14/17 1420 100 %     Weight 07/14/17 1418 140 lb (63.5 kg)     Height 07/14/17 1418 4\' 10"  (1.473 m)     Head Circumference --      Peak Flow --      Pain Score 07/14/17 1418 10     Pain Loc --      Pain Edu? --      Excl. in [place]? --    No data found.   Updated Vital Signs BP (!) 155/73 (BP Location: Left Arm)   Pulse 69   Temp 98.1 F (36.7  C) (Oral)   Resp 16   Ht 4\' 10"  (1.473 m)   Wt 140 lb (63.5 kg)   SpO2 100%   BMI 29.26 kg/m   Physical Exam  Constitutional: She is oriented to person, place, and time. She appears well-developed and well-nourished. No distress.  HENT:  Head: Normocephalic and atraumatic.  Eyes: Pupils are equal, round, and reactive to light.  Neck: Normal range of motion.  Cardiovascular: Normal rate.   Neurological: She is oriented to person, place, and time. No cranial nerve deficit.  Skin: Skin is warm. Capillary refill takes less than 2 seconds. Rash noted.     Petechial type rash to the left lower leg. Rash covers the anterior part of the leg is about 2.5 inches from top to bottom with a bandlike appearance. There is more confluence on the lower pole the rash but she can see more petechial type appearance and other areas. No pain to palpation. It is non-blanchable. Patient does have some varicose veins along the left edge of the rash and continuing above the rash.     UC Treatments / Results   Labs (all labs ordered are listed, but only abnormal results are displayed) Labs Reviewed - No data to display  EKG  EKG Interpretation None       Radiology No results found.  Procedures Procedures (including critical care time)  Medications Ordered in UC Medications - No data to display   Initial Impression / Assessment and Plan / UC Course  I have reviewed the triage vital signs and the nursing notes.  Pertinent labs & imaging results that were available during my care of the patient were reviewed by me and considered in my medical decision making (see chart for details).    Patient with a red non-blanchable rash that is in the same year where she had previously worn a tight compression sleeve that was given to her by a podiatrist. No pain to palpation. Bleeding most likely this is more of a reaction due to the tightness of the sleeve other than a reaction to the sleeve itself. Will have patient watch it and then follow with dermatology if needed.  Final Clinical Impressions(s) / UC Diagnoses   Final diagnoses:  Vasculitis of skin    New Prescriptions New Prescriptions   No medications on file     Controlled Substance Prescriptions [place] Controlled Substance Registry consulted? Not Applicable   Luvenia Redden, PA-C    Luvenia Redden, PA-C 07/14/17 [phone]

## 2017-08-05 DIAGNOSIS — N3281 Overactive bladder: Secondary | ICD-10-CM | POA: Diagnosis not present

## 2017-08-05 DIAGNOSIS — I10 Essential (primary) hypertension: Secondary | ICD-10-CM | POA: Diagnosis not present

## 2017-08-05 DIAGNOSIS — K219 Gastro-esophageal reflux disease without esophagitis: Secondary | ICD-10-CM | POA: Diagnosis not present

## 2017-08-05 DIAGNOSIS — E7800 Pure hypercholesterolemia, unspecified: Secondary | ICD-10-CM | POA: Diagnosis not present

## 2017-08-05 DIAGNOSIS — M159 Polyosteoarthritis, unspecified: Secondary | ICD-10-CM | POA: Diagnosis not present

## 2017-08-05 DIAGNOSIS — Z0000 Encounter for general adult medical examination without abnormal findings: Secondary | ICD-10-CM | POA: Diagnosis not present

## 2017-08-05 DIAGNOSIS — Z23 Encounter for immunization: Secondary | ICD-10-CM | POA: Diagnosis not present

## 2017-09-11 DIAGNOSIS — N814 Uterovaginal prolapse, unspecified: Secondary | ICD-10-CM | POA: Diagnosis not present

## 2017-10-04 DIAGNOSIS — R0789 Other chest pain: Secondary | ICD-10-CM | POA: Diagnosis not present

## 2017-10-04 DIAGNOSIS — K219 Gastro-esophageal reflux disease without esophagitis: Secondary | ICD-10-CM | POA: Diagnosis not present

## 2017-10-17 DIAGNOSIS — M79662 Pain in left lower leg: Secondary | ICD-10-CM | POA: Diagnosis not present

## 2017-10-17 DIAGNOSIS — R203 Hyperesthesia: Secondary | ICD-10-CM | POA: Diagnosis not present

## 2017-10-18 DIAGNOSIS — N8111 Cystocele, midline: Secondary | ICD-10-CM | POA: Diagnosis not present

## 2017-10-18 DIAGNOSIS — N814 Uterovaginal prolapse, unspecified: Secondary | ICD-10-CM | POA: Diagnosis not present

## 2017-11-05 DIAGNOSIS — N898 Other specified noninflammatory disorders of vagina: Secondary | ICD-10-CM | POA: Diagnosis not present

## 2017-11-05 DIAGNOSIS — B373 Candidiasis of vulva and vagina: Secondary | ICD-10-CM | POA: Diagnosis not present

## 2017-11-05 DIAGNOSIS — B9689 Other specified bacterial agents as the cause of diseases classified elsewhere: Secondary | ICD-10-CM | POA: Diagnosis not present

## 2017-11-05 DIAGNOSIS — Z4689 Encounter for fitting and adjustment of other specified devices: Secondary | ICD-10-CM | POA: Diagnosis not present

## 2017-11-05 DIAGNOSIS — N760 Acute vaginitis: Secondary | ICD-10-CM | POA: Diagnosis not present

## 2017-11-05 DIAGNOSIS — R300 Dysuria: Secondary | ICD-10-CM | POA: Diagnosis not present

## 2017-11-13 DIAGNOSIS — M79662 Pain in left lower leg: Secondary | ICD-10-CM | POA: Diagnosis not present

## 2017-11-13 DIAGNOSIS — M7552 Bursitis of left shoulder: Secondary | ICD-10-CM | POA: Diagnosis not present

## 2017-12-25 DIAGNOSIS — M25562 Pain in left knee: Secondary | ICD-10-CM | POA: Diagnosis not present

## 2017-12-25 DIAGNOSIS — S8002XA Contusion of left knee, initial encounter: Secondary | ICD-10-CM | POA: Diagnosis not present

## 2017-12-25 DIAGNOSIS — E669 Obesity, unspecified: Secondary | ICD-10-CM | POA: Diagnosis not present

## 2017-12-25 DIAGNOSIS — S8012XA Contusion of left lower leg, initial encounter: Secondary | ICD-10-CM | POA: Diagnosis not present

## 2017-12-25 DIAGNOSIS — M1712 Unilateral primary osteoarthritis, left knee: Secondary | ICD-10-CM | POA: Diagnosis not present

## 2018-01-02 DIAGNOSIS — N898 Other specified noninflammatory disorders of vagina: Secondary | ICD-10-CM | POA: Diagnosis not present

## 2018-01-02 DIAGNOSIS — B9689 Other specified bacterial agents as the cause of diseases classified elsewhere: Secondary | ICD-10-CM | POA: Diagnosis not present

## 2018-01-02 DIAGNOSIS — N760 Acute vaginitis: Secondary | ICD-10-CM | POA: Diagnosis not present

## 2018-01-02 DIAGNOSIS — Z4689 Encounter for fitting and adjustment of other specified devices: Secondary | ICD-10-CM | POA: Diagnosis not present

## 2018-02-03 DIAGNOSIS — R300 Dysuria: Secondary | ICD-10-CM | POA: Diagnosis not present

## 2018-02-03 DIAGNOSIS — I10 Essential (primary) hypertension: Secondary | ICD-10-CM | POA: Diagnosis not present

## 2018-02-03 DIAGNOSIS — M159 Polyosteoarthritis, unspecified: Secondary | ICD-10-CM | POA: Diagnosis not present

## 2018-02-03 DIAGNOSIS — N3281 Overactive bladder: Secondary | ICD-10-CM | POA: Diagnosis not present

## 2018-02-03 DIAGNOSIS — E7800 Pure hypercholesterolemia, unspecified: Secondary | ICD-10-CM | POA: Diagnosis not present

## 2018-02-03 DIAGNOSIS — K219 Gastro-esophageal reflux disease without esophagitis: Secondary | ICD-10-CM | POA: Diagnosis not present

## 2018-03-02 ENCOUNTER — Ambulatory Visit
Admission: EM | Admit: 2018-03-02 | Discharge: 2018-03-02 | Disposition: A | Discharge status: Home / Self Care | Payer: Medicare HMO | Attending: Emergency Medicine | Admitting: Emergency Medicine

## 2018-03-02 ENCOUNTER — Encounter: Payer: Self-pay | Admitting: Gynecology

## 2018-03-02 ENCOUNTER — Other Ambulatory Visit: Payer: Self-pay

## 2018-03-02 DIAGNOSIS — M7541 Impingement syndrome of right shoulder: Secondary | ICD-10-CM | POA: Diagnosis not present

## 2018-03-02 HISTORY — DX: Unspecified osteoarthritis, unspecified site: M19.90

## 2018-03-02 MED ORDER — MELOXICAM 7.5 MG PO TABS: 7.5000 mg | ORAL_TABLET | Freq: Every day | ORAL | 0 refills | Status: DC

## 2018-03-02 MED ORDER — NAPROXEN 375 MG PO TABS: 375.0000 mg | ORAL_TABLET | Freq: Two times a day (BID) | ORAL | 0 refills | Status: DC

## 2018-03-02 NOTE — ED Triage Notes (Signed)
Per patient right arm pain. Per patient no injury to her right arm. Patient ask for cortisone injection for her right arm.Marland Kitchen

## 2018-03-02 NOTE — ED Provider Notes (Addendum)
[facility]    CSN: [id number] Arrival date & time: 03/02/18  1048     History   Chief Complaint Chief Complaint  Patient presents with  . Arm Pain    HPI Kaitlin Wilson is a 67 y.o. female.   HPI  67 year old female presents with right dominant upper arm and shoulder pain.  She has had no injury to her arm that she remembers.  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  She states that she does lift push and pull in her job description.  Present time her pain is of the right shoulder laterally and extends into her upper arm to the level of the elbow.  He has most of her discomfort when moving her arm away from her side or behind her back.  Had no neck discomfort.  View of available medical records she is under the care of Dr. Ellison Hughs has had impingement syndrome on the left side.  Any radiation of the pain below her elbow and he denies any numbness or tingling.       Past Medical History:  Diagnosis Date  . Arthritis   . GERD (gastroesophageal reflux disease)   . Hypertension     Patient Active Problem List   Diagnosis Date Noted  . Hypertension 02/20/2015    History reviewed. No pertinent surgical history.  OB History   None      Home Medications    Prior to Admission medications   Medication Sig Start Date End Date Taking? Authorizing Provider  Cholecalciferol (VITAMIN D) 2000 units tablet Take by mouth.   Yes [provider]  losartan (COZAAR) 25 MG tablet TAKE 1 TABLET (25 MG TOTAL) BY MOUTH ONCE DAILY. 12/23/15  Yes [provider]  naproxen (NAPROSYN) 375 MG tablet Take 1 tablet (375 mg total) by mouth 2 (two) times daily. 03/02/18   Lorin Picket, PA-C  NONFORMULARY OR COMPOUNDED ITEM Apply 1-2 g topically 4 (four) times daily. 08/17/16   Edrick Kins, DPM    Family History Family History  Problem Relation Age of Onset  . Stroke Father   . Kidney cancer Neg Hx   . Bladder Cancer Neg Hx     Social  History Social History   Tobacco Use  . Smoking status: Never Smoker  . Smokeless tobacco: Never Used  Substance Use Topics  . Alcohol use: No  . Drug use: No     Allergies   Patient has no known allergies.   Review of Systems Review of Systems  Constitutional: Positive for activity change. Negative for chills, fatigue and fever.  Musculoskeletal: Positive for arthralgias.  All other systems reviewed and are negative.    Physical Exam Triage Vital Signs ED Triage Vitals  Enc Vitals Group     BP 03/02/18 1100 138/70     Pulse Rate 03/02/18 1100 73     Resp 03/02/18 1100 16     Temp 03/02/18 1100 97.9 F (36.6 C)     Temp Source 03/02/18 1100 Oral     SpO2 03/02/18 1100 100 %     Weight 03/02/18 1101 145 lb (65.8 kg)     Height 03/02/18 1101 4\' 10"  (1.473 m)     Head Circumference --      Peak Flow --      Pain Score 03/02/18 1059 9     Pain Loc --      Pain Edu? --      Excl.  in [place]? --    No data found.  Updated Vital Signs BP 138/70 (BP Location: Left Arm)   Pulse 73   Temp 97.9 F (36.6 C) (Oral)   Resp 16   Ht 4\' 10"  (1.473 m)   Wt 145 lb (65.8 kg)   SpO2 100%   BMI 30.31 kg/m   Visual Acuity Right Eye Distance:   Left Eye Distance:   Bilateral Distance:    Right Eye Near:   Left Eye Near:    Bilateral Near:     Physical Exam  Constitutional: She is oriented to person, place, and time. She appears well-developed and well-nourished. No distress.  HENT:  Head: Normocephalic.  Eyes: Pupils are equal, round, and reactive to light. Right eye exhibits no discharge. Left eye exhibits no discharge.  Neck: Normal range of motion.  Musculoskeletal: She exhibits tenderness.  Examination of the neck shows good range of motion of the cervical spine with no discomfort.  Have tenderness over the acromial / subacromial space particularly anteriorly.  Active range of motion shows abduction to 75 degrees with discomfort.  Abduction and external rotation is  very painful.  She has a positive empty can test and arm raise lower test.  Neurovascular function is intact distally.  Neurological: She is alert and oriented to person, place, and time.  Skin: Skin is warm and dry. She is not diaphoretic.  Psychiatric: She has a normal mood and affect. Her behavior is normal. Judgment and thought content normal.  Nursing note and vitals reviewed.    UC Treatments / Results  Labs (all labs ordered are listed, but only abnormal results are displayed) Labs Reviewed - No data to display  EKG None  Radiology No results found.  Procedures Procedures (including critical care time)  Medications Ordered in UC Medications - No data to display  Initial Impression / Assessment and Plan / UC Course  I have reviewed the triage vital signs and the nursing notes.  Pertinent labs & imaging results that were available during my care of the patient were reviewed by me and considered in my medical decision making (see chart for details).     Plan: 1. Test/x-ray results and diagnosis reviewed with patient 2. rx as per orders; risks, benefits, potential side effects reviewed with patient 3. Recommend supportive treatment with nonsteroidal anti-inflammatory drugs rest and symptom avoidance.  She was instructed and demonstrated pendulum exercises that she will perform at least 3 times daily for 1 to 2 minutes each time.  Give her restrictions at work for about 14 days.  She is not improving I have recommended that she follow-up with her primary care physician or orthopedic surgeon.  Use ice 20 minutes out of every 2 hours 4-5 times daily 4. F/u prn if symptoms worsen or don't improve  Final Clinical Impressions(s) / UC Diagnoses   Final diagnoses:  Impingement syndrome of right shoulder     Discharge Instructions     Apply ice 20 minutes out of every 2 hours 4-5 times daily for comfort.  Take the meloxicam with food; consider using omeprazole daily while  taking the medication.  Perform the pendulum exercises as demonstrated 3 times a day for at least 1 minute each time    ED Prescriptions    Medication Sig Dispense Auth. Provider   meloxicam (MOBIC) 7.5 MG tablet  (Status: Discontinued) Take 1 tablet (7.5 mg total) by mouth daily. 15 tablet Crecencio Mc P, PA-C   naproxen (NAPROSYN) 375 MG  tablet Take 1 tablet (375 mg total) by mouth 2 (two) times daily. 20 tablet Lorin Picket, PA-C     Controlled Substance Prescriptions [place] Controlled Substance Registry consulted? Not Applicable   Lorin Picket, PA-C 03/02/18 1451    Lorin Picket, PA-C 03/02/18 1455

## 2018-03-02 NOTE — Discharge Instructions (Signed)
Apply ice 20 minutes out of every 2 hours 4-5 times daily for comfort.  Take the meloxicam with food; consider using omeprazole daily while taking the medication.  Perform the pendulum exercises as demonstrated 3 times a day for at least 1 minute each time

## 2018-03-05 ENCOUNTER — Telehealth: Payer: Self-pay | Admitting: Emergency Medicine

## 2018-03-05 DIAGNOSIS — M25511 Pain in right shoulder: Secondary | ICD-10-CM | POA: Diagnosis not present

## 2018-03-05 DIAGNOSIS — E669 Obesity, unspecified: Secondary | ICD-10-CM | POA: Diagnosis not present

## 2018-03-05 DIAGNOSIS — M7551 Bursitis of right shoulder: Secondary | ICD-10-CM | POA: Diagnosis not present

## 2018-03-05 NOTE — Telephone Encounter (Signed)
Patient called in today stating that her shoulder pain is not any better. Patient requesting a joint injection. Patient states that she is taking the Naproxen sporadically and not taking Tylenol. Advised patient that we do not do joint injections and to take Naproxen twice daily as prescribed and may use Tylenol for pain, apply ice as directed. Follow up with PCP or Ortho. Patient agreed and voiced understanding and has Ortho appointment on 03/13/18.

## 2018-03-17 ENCOUNTER — Other Ambulatory Visit: Payer: Self-pay

## 2018-03-17 ENCOUNTER — Other Ambulatory Visit: Payer: Self-pay | Admitting: Gastroenterology

## 2018-03-17 ENCOUNTER — Ambulatory Visit
Admission: EM | Admit: 2018-03-17 | Discharge: 2018-03-17 | Disposition: A | Discharge status: Home / Self Care | Payer: Medicare HMO | Attending: Family Medicine | Admitting: Family Medicine

## 2018-03-17 ENCOUNTER — Encounter: Payer: Self-pay | Admitting: Emergency Medicine

## 2018-03-17 DIAGNOSIS — M7541 Impingement syndrome of right shoulder: Secondary | ICD-10-CM

## 2018-03-17 DIAGNOSIS — R1013 Epigastric pain: Secondary | ICD-10-CM

## 2018-03-17 NOTE — Discharge Instructions (Addendum)
Continue to avoid symptoms to prevent your shoulder from hurting. Follow up with ortho for any problems

## 2018-03-17 NOTE — ED Triage Notes (Signed)
Patient here to follow-up regarding right shoulder pain.  Patient states that she needs a paper filled out that says she can return to work.

## 2018-03-17 NOTE — ED Provider Notes (Signed)
[facility]    CSN: [id number] Arrival date & time: 03/17/18  1556     History   Chief Complaint Chief Complaint  Patient presents with  . Shoulder Pain    HPI Kaitlin Wilson is a 67 y.o. female.   HPI  67 year old female returns today after being seen 03/02/2018 with impingement syndrome of the right shoulder.  Time was recommended she be seen by orthopedic surgery which she did and received a steroid injection.  She states that since that time she has had much less pain is able to have better motion with her arm.  She is requesting return to work.       Past Medical History:  Diagnosis Date  . Arthritis   . GERD (gastroesophageal reflux disease)   . Hypertension     Patient Active Problem List   Diagnosis Date Noted  . Hypertension 02/20/2015    History reviewed. No pertinent surgical history.  OB History   None      Home Medications    Prior to Admission medications   Medication Sig Start Date End Date Taking? Authorizing Provider  Cholecalciferol (VITAMIN D) 2000 units tablet Take by mouth.    [provider]  losartan (COZAAR) 25 MG tablet TAKE 1 TABLET (25 MG TOTAL) BY MOUTH ONCE DAILY. 12/23/15   [provider]  naproxen (NAPROSYN) 375 MG tablet Take 1 tablet (375 mg total) by mouth 2 (two) times daily. 03/02/18   Lorin Picket, PA-C  NONFORMULARY OR COMPOUNDED ITEM Apply 1-2 g topically 4 (four) times daily. 08/17/16   Edrick Kins, DPM    Family History Family History  Problem Relation Age of Onset  . Stroke Father   . Kidney cancer Neg Hx   . Bladder Cancer Neg Hx     Social History Social History   Tobacco Use  . Smoking status: Never Smoker  . Smokeless tobacco: Never Used  Substance Use Topics  . Alcohol use: No  . Drug use: No     Allergies   Patient has no known allergies.   Review of Systems Review of Systems  Constitutional: Positive for activity change. Negative for appetite change,  chills, fatigue and fever.  Musculoskeletal: Positive for myalgias.  All other systems reviewed and are negative.    Physical Exam Triage Vital Signs ED Triage Vitals  Enc Vitals Group     BP 03/17/18 1655 (!) 150/83     Pulse Rate 03/17/18 1655 61     Resp 03/17/18 1655 16     Temp 03/17/18 1655 98 F (36.7 C)     Temp Source 03/17/18 1655 Oral     SpO2 03/17/18 1655 100 %     Weight 03/17/18 1653 150 lb (68 kg)     Height 03/17/18 1653 4\' 11"  (1.499 m)     Head Circumference --      Peak Flow --      Pain Score 03/17/18 1653 0     Pain Loc --      Pain Edu? --      Excl. in [place]? --    No data found.  Updated Vital Signs BP (!) 150/83 (BP Location: Left Arm)   Pulse 61   Temp 98 F (36.7 C) (Oral)   Resp 16   Ht 4\' 11"  (1.499 m)   Wt 150 lb (68 kg)   SpO2 100%   BMI 30.30 kg/m   Visual Acuity Right Eye Distance:  Left Eye Distance:   Bilateral Distance:    Right Eye Near:   Left Eye Near:    Bilateral Near:     Physical Exam  Constitutional: She is oriented to person, place, and time. She appears well-developed and well-nourished. No distress.  HENT:  Head: Normocephalic.  Eyes: Pupils are equal, round, and reactive to light. Right eye exhibits no discharge. Left eye exhibits no discharge.  Neck: Normal range of motion.  Musculoskeletal: She exhibits tenderness. She exhibits no edema or deformity.  Examination of the right shoulder shows improved range of motion.  She is able to abduct to 90degrees forward flex to about 85 degrees internal/external rotation are 80 degrees and 35 degrees respectively.  Is less tenderness about the shoulder.  Neurological: She is alert and oriented to person, place, and time.  Skin: Skin is warm and dry. She is not diaphoretic.  Psychiatric: She has a normal mood and affect. Her behavior is normal. Judgment and thought content normal.  Nursing note and vitals reviewed.    UC Treatments / Results  Labs (all labs ordered  are listed, but only abnormal results are displayed) Labs Reviewed - No data to display  EKG None  Radiology No results found.  Procedures Procedures (including critical care time)  Medications Ordered in UC Medications - No data to display  Initial Impression / Assessment and Plan / UC Course  I have reviewed the triage vital signs and the nursing notes.  Pertinent labs & imaging results that were available during my care of the patient were reviewed by me and considered in my medical decision making (see chart for details).    Plan: 1. Test/x-ray results and diagnosis reviewed with patient 2. rx as per orders; risks, benefits, potential side effects reviewed with patient 3. Recommend supportive treatment with motion exercises which she has been finding on [employer].  And following up with the orthopedic surgeon if she continues to have problems.  Otherwise I believe that she is safe to return to work at this time. 4. F/u prn if symptoms worsen or don't improve  Final Clinical Impressions(s) / UC Diagnoses   Final diagnoses:  Impingement syndrome of right shoulder     Discharge Instructions     Continue to avoid symptoms to prevent your shoulder from hurting. Follow up with ortho for any problems   ED Prescriptions    None     Controlled Substance Prescriptions [place] Controlled Substance Registry consulted? Not Applicable   Lorin Picket, PA-C 03/17/18 2025

## 2018-03-21 ENCOUNTER — Ambulatory Visit: Payer: Medicare HMO

## 2018-03-26 DIAGNOSIS — E669 Obesity, unspecified: Secondary | ICD-10-CM | POA: Diagnosis not present

## 2018-03-26 DIAGNOSIS — M7551 Bursitis of right shoulder: Secondary | ICD-10-CM | POA: Diagnosis not present

## 2018-04-08 DIAGNOSIS — N898 Other specified noninflammatory disorders of vagina: Secondary | ICD-10-CM | POA: Diagnosis not present

## 2018-04-08 DIAGNOSIS — N760 Acute vaginitis: Secondary | ICD-10-CM | POA: Diagnosis not present

## 2018-04-08 DIAGNOSIS — Z4689 Encounter for fitting and adjustment of other specified devices: Secondary | ICD-10-CM | POA: Diagnosis not present

## 2018-04-08 DIAGNOSIS — B9689 Other specified bacterial agents as the cause of diseases classified elsewhere: Secondary | ICD-10-CM | POA: Diagnosis not present

## 2018-04-16 ENCOUNTER — Ambulatory Visit: Payer: Medicare HMO

## 2018-05-28 DIAGNOSIS — M7551 Bursitis of right shoulder: Secondary | ICD-10-CM | POA: Diagnosis not present

## 2018-05-28 DIAGNOSIS — E669 Obesity, unspecified: Secondary | ICD-10-CM | POA: Diagnosis not present

## 2018-05-28 DIAGNOSIS — M25311 Other instability, right shoulder: Secondary | ICD-10-CM | POA: Diagnosis not present

## 2018-05-28 DIAGNOSIS — M25511 Pain in right shoulder: Secondary | ICD-10-CM | POA: Diagnosis not present

## 2018-05-29 DIAGNOSIS — Z4689 Encounter for fitting and adjustment of other specified devices: Secondary | ICD-10-CM | POA: Diagnosis not present

## 2018-05-29 DIAGNOSIS — N814 Uterovaginal prolapse, unspecified: Secondary | ICD-10-CM | POA: Diagnosis not present

## 2018-06-12 DIAGNOSIS — M7551 Bursitis of right shoulder: Secondary | ICD-10-CM | POA: Diagnosis not present

## 2018-06-12 DIAGNOSIS — M25511 Pain in right shoulder: Secondary | ICD-10-CM | POA: Diagnosis not present

## 2018-06-12 DIAGNOSIS — R531 Weakness: Secondary | ICD-10-CM | POA: Diagnosis not present

## 2018-06-12 DIAGNOSIS — R29898 Other symptoms and signs involving the musculoskeletal system: Secondary | ICD-10-CM | POA: Diagnosis not present

## 2018-06-17 DIAGNOSIS — M25611 Stiffness of right shoulder, not elsewhere classified: Secondary | ICD-10-CM | POA: Diagnosis not present

## 2018-06-17 DIAGNOSIS — M25511 Pain in right shoulder: Secondary | ICD-10-CM | POA: Diagnosis not present

## 2018-06-17 DIAGNOSIS — R531 Weakness: Secondary | ICD-10-CM | POA: Diagnosis not present

## 2018-06-17 DIAGNOSIS — M7551 Bursitis of right shoulder: Secondary | ICD-10-CM | POA: Diagnosis not present

## 2018-06-17 DIAGNOSIS — R29898 Other symptoms and signs involving the musculoskeletal system: Secondary | ICD-10-CM | POA: Diagnosis not present

## 2018-06-25 DIAGNOSIS — R29898 Other symptoms and signs involving the musculoskeletal system: Secondary | ICD-10-CM | POA: Diagnosis not present

## 2018-06-25 DIAGNOSIS — E669 Obesity, unspecified: Secondary | ICD-10-CM | POA: Diagnosis not present

## 2018-06-25 DIAGNOSIS — M7551 Bursitis of right shoulder: Secondary | ICD-10-CM | POA: Diagnosis not present

## 2018-06-25 DIAGNOSIS — M25511 Pain in right shoulder: Secondary | ICD-10-CM | POA: Diagnosis not present

## 2018-06-25 DIAGNOSIS — R531 Weakness: Secondary | ICD-10-CM | POA: Diagnosis not present

## 2018-06-25 DIAGNOSIS — M25611 Stiffness of right shoulder, not elsewhere classified: Secondary | ICD-10-CM | POA: Diagnosis not present

## 2018-06-27 DIAGNOSIS — M7551 Bursitis of right shoulder: Secondary | ICD-10-CM | POA: Diagnosis not present

## 2018-06-27 DIAGNOSIS — M25511 Pain in right shoulder: Secondary | ICD-10-CM | POA: Diagnosis not present

## 2018-06-27 DIAGNOSIS — R531 Weakness: Secondary | ICD-10-CM | POA: Diagnosis not present

## 2018-06-27 DIAGNOSIS — M25611 Stiffness of right shoulder, not elsewhere classified: Secondary | ICD-10-CM | POA: Diagnosis not present

## 2018-06-27 DIAGNOSIS — R29898 Other symptoms and signs involving the musculoskeletal system: Secondary | ICD-10-CM | POA: Diagnosis not present

## 2018-06-30 DIAGNOSIS — M25511 Pain in right shoulder: Secondary | ICD-10-CM | POA: Diagnosis not present

## 2018-06-30 DIAGNOSIS — M25611 Stiffness of right shoulder, not elsewhere classified: Secondary | ICD-10-CM | POA: Diagnosis not present

## 2018-06-30 DIAGNOSIS — M7551 Bursitis of right shoulder: Secondary | ICD-10-CM | POA: Diagnosis not present

## 2018-06-30 DIAGNOSIS — R531 Weakness: Secondary | ICD-10-CM | POA: Diagnosis not present

## 2018-06-30 DIAGNOSIS — R29898 Other symptoms and signs involving the musculoskeletal system: Secondary | ICD-10-CM | POA: Diagnosis not present

## 2018-07-09 DIAGNOSIS — Z4689 Encounter for fitting and adjustment of other specified devices: Secondary | ICD-10-CM | POA: Diagnosis not present

## 2018-07-09 DIAGNOSIS — B373 Candidiasis of vulva and vagina: Secondary | ICD-10-CM | POA: Diagnosis not present

## 2018-07-09 DIAGNOSIS — N898 Other specified noninflammatory disorders of vagina: Secondary | ICD-10-CM | POA: Diagnosis not present

## 2018-07-11 DIAGNOSIS — M25611 Stiffness of right shoulder, not elsewhere classified: Secondary | ICD-10-CM | POA: Diagnosis not present

## 2018-07-11 DIAGNOSIS — M7551 Bursitis of right shoulder: Secondary | ICD-10-CM | POA: Diagnosis not present

## 2018-07-11 DIAGNOSIS — R29898 Other symptoms and signs involving the musculoskeletal system: Secondary | ICD-10-CM | POA: Diagnosis not present

## 2018-07-11 DIAGNOSIS — R531 Weakness: Secondary | ICD-10-CM | POA: Diagnosis not present

## 2018-07-11 DIAGNOSIS — M25511 Pain in right shoulder: Secondary | ICD-10-CM | POA: Diagnosis not present

## 2018-07-14 DIAGNOSIS — M25511 Pain in right shoulder: Secondary | ICD-10-CM | POA: Diagnosis not present

## 2018-07-14 DIAGNOSIS — M25611 Stiffness of right shoulder, not elsewhere classified: Secondary | ICD-10-CM | POA: Diagnosis not present

## 2018-07-14 DIAGNOSIS — R531 Weakness: Secondary | ICD-10-CM | POA: Diagnosis not present

## 2018-07-14 DIAGNOSIS — R29898 Other symptoms and signs involving the musculoskeletal system: Secondary | ICD-10-CM | POA: Diagnosis not present

## 2018-07-14 DIAGNOSIS — M7551 Bursitis of right shoulder: Secondary | ICD-10-CM | POA: Diagnosis not present

## 2018-07-15 DIAGNOSIS — R531 Weakness: Secondary | ICD-10-CM | POA: Diagnosis not present

## 2018-07-15 DIAGNOSIS — M7551 Bursitis of right shoulder: Secondary | ICD-10-CM | POA: Diagnosis not present

## 2018-07-15 DIAGNOSIS — M25511 Pain in right shoulder: Secondary | ICD-10-CM | POA: Diagnosis not present

## 2018-07-15 DIAGNOSIS — R29898 Other symptoms and signs involving the musculoskeletal system: Secondary | ICD-10-CM | POA: Diagnosis not present

## 2018-07-15 DIAGNOSIS — M25611 Stiffness of right shoulder, not elsewhere classified: Secondary | ICD-10-CM | POA: Diagnosis not present

## 2018-08-05 DIAGNOSIS — Z0000 Encounter for general adult medical examination without abnormal findings: Secondary | ICD-10-CM | POA: Diagnosis not present

## 2018-08-05 DIAGNOSIS — K219 Gastro-esophageal reflux disease without esophagitis: Secondary | ICD-10-CM | POA: Diagnosis not present

## 2018-08-05 DIAGNOSIS — Z1211 Encounter for screening for malignant neoplasm of colon: Secondary | ICD-10-CM | POA: Diagnosis not present

## 2018-08-05 DIAGNOSIS — E7800 Pure hypercholesterolemia, unspecified: Secondary | ICD-10-CM | POA: Diagnosis not present

## 2018-08-05 DIAGNOSIS — R252 Cramp and spasm: Secondary | ICD-10-CM | POA: Diagnosis not present

## 2018-08-05 DIAGNOSIS — I10 Essential (primary) hypertension: Secondary | ICD-10-CM | POA: Diagnosis not present

## 2018-08-05 DIAGNOSIS — N3281 Overactive bladder: Secondary | ICD-10-CM | POA: Diagnosis not present

## 2018-08-05 DIAGNOSIS — M159 Polyosteoarthritis, unspecified: Secondary | ICD-10-CM | POA: Diagnosis not present

## 2018-08-15 DIAGNOSIS — Z1211 Encounter for screening for malignant neoplasm of colon: Secondary | ICD-10-CM | POA: Diagnosis not present

## 2018-10-01 DIAGNOSIS — M85859 Other specified disorders of bone density and structure, unspecified thigh: Secondary | ICD-10-CM

## 2018-10-01 HISTORY — DX: Other specified disorders of bone density and structure, unspecified thigh: M85.859

## 2018-10-02 ENCOUNTER — Other Ambulatory Visit: Payer: Self-pay

## 2018-10-02 ENCOUNTER — Encounter: Payer: Self-pay | Admitting: Gynecology

## 2018-10-02 ENCOUNTER — Ambulatory Visit
Admission: EM | Admit: 2018-10-02 | Discharge: 2018-10-02 | Disposition: A | Discharge status: Home / Self Care | Payer: Medicare HMO | Attending: Family Medicine | Admitting: Family Medicine

## 2018-10-02 DIAGNOSIS — N760 Acute vaginitis: Secondary | ICD-10-CM | POA: Diagnosis not present

## 2018-10-02 DIAGNOSIS — B9689 Other specified bacterial agents as the cause of diseases classified elsewhere: Secondary | ICD-10-CM | POA: Diagnosis not present

## 2018-10-02 DIAGNOSIS — B373 Candidiasis of vulva and vagina: Secondary | ICD-10-CM | POA: Insufficient documentation

## 2018-10-02 DIAGNOSIS — B3731 Acute candidiasis of vulva and vagina: Secondary | ICD-10-CM

## 2018-10-02 LAB — URINALYSIS, COMPLETE (UACMP) WITH MICROSCOPIC
Bilirubin Urine: NEGATIVE
GLUCOSE, UA: NEGATIVE mg/dL
Ketones, ur: NEGATIVE mg/dL
NITRITE: NEGATIVE
PH: 6.5 (ref 5.0–8.0)
Protein, ur: NEGATIVE mg/dL
SPECIFIC GRAVITY, URINE: 1.015 (ref 1.005–1.030)

## 2018-10-02 LAB — WET PREP, GENITAL
Sperm: NONE SEEN
Trich, Wet Prep: NONE SEEN

## 2018-10-02 MED ORDER — METRONIDAZOLE 500 MG PO TABS: 500.0000 mg | ORAL_TABLET | Freq: Two times a day (BID) | ORAL | 0 refills | Status: DC

## 2018-10-02 MED ORDER — FLUCONAZOLE 150 MG PO TABS: ORAL_TABLET | ORAL | 0 refills | Status: DC

## 2018-10-02 NOTE — ED Provider Notes (Signed)
[facility]    CSN: [id number] Arrival date & time: 10/02/18  1650     History   Chief Complaint No chief complaint on file.   HPI Kaitlin Wilson is a 68 y.o. female.   HPI  -year-old female presents with frequency and urgency lower pelvic pain that started yesterday.  Has a pessary that is been in place for 3 years.  She has an appointment with the GYN for removal exchange for a new pessary.  Also relates a very significant discharge vaginally.  Trying to push the urinary back in but is not able to get it high enough and it keeps slipping down.  No fever is endorse chills.  No nausea or vomiting.         Past Medical History:  Diagnosis Date  . Arthritis   . GERD (gastroesophageal reflux disease)   . Hypertension     Patient Active Problem List   Diagnosis Date Noted  . Hypertension 02/20/2015    History reviewed. No pertinent surgical history.  OB History   No obstetric history on file.      Home Medications    Prior to Admission medications   Medication Sig Start Date End Date Taking? Authorizing Provider  losartan (COZAAR) 25 MG tablet TAKE 1 TABLET (25 MG TOTAL) BY MOUTH ONCE DAILY. 12/23/15  Yes [provider]  oxybutynin (DITROPAN) 5 MG tablet Take by mouth. 08/08/18 08/08/19 Yes [provider]  Cholecalciferol (VITAMIN D) 2000 units tablet Take by mouth.    [provider]  fluconazole (DIFLUCAN) 150 MG tablet Take one tab for symptoms of yeast infection. Repeat x 1 in 72 hours. 10/02/18   Lorin Picket, PA-C  metroNIDAZOLE (FLAGYL) 500 MG tablet Take 1 tablet (500 mg total) by mouth 2 (two) times daily. 10/02/18   Lorin Picket, PA-C  NONFORMULARY OR COMPOUNDED ITEM Apply 1-2 g topically 4 (four) times daily. 08/17/16   Edrick Kins, DPM    Family History Family History  Problem Relation Age of Onset  . Stroke Father   . Kidney cancer Neg Hx   . Bladder Cancer Neg Hx     Social History Social  History   Tobacco Use  . Smoking status: Never Smoker  . Smokeless tobacco: Never Used  Substance Use Topics  . Alcohol use: No  . Drug use: No     Allergies   Patient has no known allergies.   Review of Systems Review of Systems  Constitutional: Positive for activity change and chills. Negative for appetite change, diaphoresis, fatigue and fever.  Genitourinary: Positive for frequency, pelvic pain, urgency and vaginal discharge. Negative for dysuria, flank pain and hematuria.  All other systems reviewed and are negative.    Physical Exam Triage Vital Signs ED Triage Vitals  Enc Vitals Group     BP 10/02/18 1801 (!) 145/84     Pulse Rate 10/02/18 1801 72     Resp 10/02/18 1801 16     Temp 10/02/18 1801 98 F (36.7 C)     Temp Source 10/02/18 1801 Oral     SpO2 10/02/18 1801 100 %     Weight 10/02/18 1802 147 lb (66.7 kg)     Height 10/02/18 1802 4\' 10"  (1.473 m)     Head Circumference --      Peak Flow --      Pain Score 10/02/18 1759 0     Pain Loc --      Pain  Edu? --      Excl. in [place]? --    No data found.  Updated Vital Signs BP (!) 145/84 (BP Location: Left Arm)   Pulse 72   Temp 98 F (36.7 C) (Oral)   Resp 16   Ht 4\' 10"  (1.473 m)   Wt 147 lb (66.7 kg)   SpO2 100%   BMI 30.72 kg/m   Visual Acuity Right Eye Distance:   Left Eye Distance:   Bilateral Distance:    Right Eye Near:   Left Eye Near:    Bilateral Near:     Physical Exam Vitals signs and nursing note reviewed.  Constitutional:      General: She is not in acute distress.    Appearance: Normal appearance. She is not ill-appearing, toxic-appearing or diaphoretic.  HENT:     Head: Normocephalic.     Nose: Nose normal.     Mouth/Throat:     Mouth: Mucous membranes are moist.  Eyes:     General:        Right eye: No discharge.        Left eye: No discharge.     Conjunctiva/sclera: Conjunctivae normal.  Neck:     Musculoskeletal: Normal range of motion and neck supple.    Pulmonary:     Effort: Pulmonary effort is normal.     Breath sounds: Normal breath sounds.  Abdominal:     Palpations: Abdomen is soft.  Genitourinary:    Comments: Obtained a self swab Musculoskeletal: Normal range of motion.  Skin:    General: Skin is warm and dry.  Neurological:     General: No focal deficit present.     Mental Status: She is alert and oriented to person, place, and time.  Psychiatric:        Mood and Affect: Mood normal.        Behavior: Behavior normal.        Thought Content: Thought content normal.        Judgment: Judgment normal.      UC Treatments / Results  Labs (all labs ordered are listed, but only abnormal results are displayed) Labs Reviewed  WET PREP, GENITAL - Abnormal; Notable for the following components:      Result Value   Yeast Wet Prep HPF POC PRESENT (*)    Clue Cells Wet Prep HPF POC PRESENT (*)    WBC, Wet Prep HPF POC MODERATE (*)    All other components within normal limits  URINALYSIS, COMPLETE (UACMP) WITH MICROSCOPIC - Abnormal; Notable for the following components:   Hgb urine dipstick TRACE (*)    Leukocytes, UA SMALL (*)    Bacteria, UA FEW (*)    All other components within normal limits  URINE CULTURE    EKG None  Radiology No results found.  Procedures Procedures (including critical care time)  Medications Ordered in UC Medications - No data to display  Initial Impression / Assessment and Plan / UC Course  I have reviewed the triage vital signs and the nursing notes.  Pertinent labs & imaging results that were available during my care of the patient were reviewed by me and considered in my medical decision making (see chart for details).   She has BV and a yeast infection.  Urinary symptoms are quite likely from the pessary not in good position.  We will culture her urine for completeness .  We will treat her for the BV and the yeast infection.  Already  has an appointment with her GYN on Tuesday for  exchange of the pessary and he will address her further female issues at that time.   Final Clinical Impressions(s) / UC Diagnoses   Final diagnoses:  BV (bacterial vaginosis)  Yeast infection of the vagina     Discharge Instructions     Follow-up with your GYN next Tuesday.    ED Prescriptions    Medication Sig Dispense Auth. Provider   fluconazole (DIFLUCAN) 150 MG tablet Take one tab for symptoms of yeast infection. Repeat x 1 in 72 hours. 2 tablet Crecencio Mc P, PA-C   metroNIDAZOLE (FLAGYL) 500 MG tablet Take 1 tablet (500 mg total) by mouth 2 (two) times daily. 14 tablet Lorin Picket, PA-C     Controlled Substance Prescriptions [place] Controlled Substance Registry consulted? Not Applicable   Lorin Picket, PA-C 10/02/18 2023

## 2018-10-02 NOTE — Discharge Instructions (Signed)
Follow-up with your GYN next Tuesday.

## 2018-10-02 NOTE — ED Triage Notes (Signed)
Patient c/o frequent urination/ lower pelvic pain x yesterday

## 2018-10-04 LAB — URINE CULTURE: Culture: 30000 — AB

## 2018-10-07 DIAGNOSIS — Z4689 Encounter for fitting and adjustment of other specified devices: Secondary | ICD-10-CM | POA: Diagnosis not present

## 2018-10-07 DIAGNOSIS — N898 Other specified noninflammatory disorders of vagina: Secondary | ICD-10-CM | POA: Diagnosis not present

## 2018-10-08 DIAGNOSIS — Z4689 Encounter for fitting and adjustment of other specified devices: Secondary | ICD-10-CM | POA: Diagnosis not present

## 2018-11-06 DIAGNOSIS — N898 Other specified noninflammatory disorders of vagina: Secondary | ICD-10-CM | POA: Diagnosis not present

## 2018-11-06 DIAGNOSIS — R102 Pelvic and perineal pain: Secondary | ICD-10-CM | POA: Diagnosis not present

## 2018-11-06 DIAGNOSIS — R300 Dysuria: Secondary | ICD-10-CM | POA: Diagnosis not present

## 2018-11-26 DIAGNOSIS — R109 Unspecified abdominal pain: Secondary | ICD-10-CM | POA: Diagnosis not present

## 2018-11-26 DIAGNOSIS — M545 Low back pain: Secondary | ICD-10-CM | POA: Diagnosis not present

## 2018-11-26 DIAGNOSIS — M5441 Lumbago with sciatica, right side: Secondary | ICD-10-CM | POA: Diagnosis not present

## 2018-12-10 DIAGNOSIS — M5441 Lumbago with sciatica, right side: Secondary | ICD-10-CM | POA: Diagnosis not present

## 2018-12-10 DIAGNOSIS — M5137 Other intervertebral disc degeneration, lumbosacral region: Secondary | ICD-10-CM | POA: Diagnosis not present

## 2018-12-10 DIAGNOSIS — M1611 Unilateral primary osteoarthritis, right hip: Secondary | ICD-10-CM | POA: Diagnosis not present

## 2018-12-10 DIAGNOSIS — E669 Obesity, unspecified: Secondary | ICD-10-CM | POA: Diagnosis not present

## 2019-03-03 DIAGNOSIS — N898 Other specified noninflammatory disorders of vagina: Secondary | ICD-10-CM | POA: Diagnosis not present

## 2019-03-03 DIAGNOSIS — Z4689 Encounter for fitting and adjustment of other specified devices: Secondary | ICD-10-CM | POA: Diagnosis not present

## 2019-03-16 DIAGNOSIS — K219 Gastro-esophageal reflux disease without esophagitis: Secondary | ICD-10-CM | POA: Diagnosis not present

## 2019-03-16 DIAGNOSIS — I10 Essential (primary) hypertension: Secondary | ICD-10-CM | POA: Diagnosis not present

## 2019-03-16 DIAGNOSIS — M79651 Pain in right thigh: Secondary | ICD-10-CM | POA: Diagnosis not present

## 2019-03-16 DIAGNOSIS — M159 Polyosteoarthritis, unspecified: Secondary | ICD-10-CM | POA: Diagnosis not present

## 2019-03-16 DIAGNOSIS — N3281 Overactive bladder: Secondary | ICD-10-CM | POA: Diagnosis not present

## 2019-03-16 DIAGNOSIS — E7800 Pure hypercholesterolemia, unspecified: Secondary | ICD-10-CM | POA: Diagnosis not present

## 2019-04-23 DIAGNOSIS — E669 Obesity, unspecified: Secondary | ICD-10-CM | POA: Diagnosis not present

## 2019-04-23 DIAGNOSIS — M1611 Unilateral primary osteoarthritis, right hip: Secondary | ICD-10-CM | POA: Diagnosis not present

## 2019-06-03 DIAGNOSIS — Z4689 Encounter for fitting and adjustment of other specified devices: Secondary | ICD-10-CM | POA: Diagnosis not present

## 2019-06-23 DIAGNOSIS — Z03818 Encounter for observation for suspected exposure to other biological agents ruled out: Secondary | ICD-10-CM | POA: Diagnosis not present

## 2019-08-18 DIAGNOSIS — N390 Urinary tract infection, site not specified: Secondary | ICD-10-CM | POA: Diagnosis not present

## 2019-08-18 DIAGNOSIS — Z03818 Encounter for observation for suspected exposure to other biological agents ruled out: Secondary | ICD-10-CM | POA: Diagnosis not present

## 2019-08-18 DIAGNOSIS — R300 Dysuria: Secondary | ICD-10-CM | POA: Diagnosis not present

## 2019-08-18 DIAGNOSIS — R6889 Other general symptoms and signs: Secondary | ICD-10-CM | POA: Diagnosis not present

## 2019-08-20 DIAGNOSIS — R319 Hematuria, unspecified: Secondary | ICD-10-CM | POA: Diagnosis not present

## 2019-08-20 DIAGNOSIS — R399 Unspecified symptoms and signs involving the genitourinary system: Secondary | ICD-10-CM | POA: Diagnosis not present

## 2019-08-20 DIAGNOSIS — N390 Urinary tract infection, site not specified: Secondary | ICD-10-CM | POA: Diagnosis not present

## 2019-08-31 DIAGNOSIS — E7800 Pure hypercholesterolemia, unspecified: Secondary | ICD-10-CM | POA: Diagnosis not present

## 2019-08-31 DIAGNOSIS — I10 Essential (primary) hypertension: Secondary | ICD-10-CM | POA: Diagnosis not present

## 2019-09-08 DIAGNOSIS — Z03818 Encounter for observation for suspected exposure to other biological agents ruled out: Secondary | ICD-10-CM | POA: Diagnosis not present

## 2019-09-11 DIAGNOSIS — N898 Other specified noninflammatory disorders of vagina: Secondary | ICD-10-CM | POA: Diagnosis not present

## 2019-09-11 DIAGNOSIS — Z4689 Encounter for fitting and adjustment of other specified devices: Secondary | ICD-10-CM | POA: Diagnosis not present

## 2019-09-11 DIAGNOSIS — B373 Candidiasis of vulva and vagina: Secondary | ICD-10-CM | POA: Diagnosis not present

## 2019-09-14 DIAGNOSIS — M159 Polyosteoarthritis, unspecified: Secondary | ICD-10-CM | POA: Diagnosis not present

## 2019-09-14 DIAGNOSIS — E7800 Pure hypercholesterolemia, unspecified: Secondary | ICD-10-CM | POA: Diagnosis not present

## 2019-09-14 DIAGNOSIS — Z1211 Encounter for screening for malignant neoplasm of colon: Secondary | ICD-10-CM | POA: Diagnosis not present

## 2019-09-14 DIAGNOSIS — N3281 Overactive bladder: Secondary | ICD-10-CM | POA: Diagnosis not present

## 2019-09-14 DIAGNOSIS — K219 Gastro-esophageal reflux disease without esophagitis: Secondary | ICD-10-CM | POA: Diagnosis not present

## 2019-09-14 DIAGNOSIS — Z79899 Other long term (current) drug therapy: Secondary | ICD-10-CM | POA: Diagnosis not present

## 2019-09-14 DIAGNOSIS — I10 Essential (primary) hypertension: Secondary | ICD-10-CM | POA: Diagnosis not present

## 2019-09-14 DIAGNOSIS — Z0000 Encounter for general adult medical examination without abnormal findings: Secondary | ICD-10-CM | POA: Diagnosis not present

## 2019-09-15 DIAGNOSIS — Z03818 Encounter for observation for suspected exposure to other biological agents ruled out: Secondary | ICD-10-CM | POA: Diagnosis not present

## 2019-09-22 DIAGNOSIS — Z03818 Encounter for observation for suspected exposure to other biological agents ruled out: Secondary | ICD-10-CM | POA: Diagnosis not present

## 2019-09-24 DIAGNOSIS — Z03818 Encounter for observation for suspected exposure to other biological agents ruled out: Secondary | ICD-10-CM | POA: Diagnosis not present

## 2019-09-29 DIAGNOSIS — Z03818 Encounter for observation for suspected exposure to other biological agents ruled out: Secondary | ICD-10-CM | POA: Diagnosis not present

## 2019-10-01 DIAGNOSIS — Z1211 Encounter for screening for malignant neoplasm of colon: Secondary | ICD-10-CM | POA: Diagnosis not present

## 2019-10-01 DIAGNOSIS — Z03818 Encounter for observation for suspected exposure to other biological agents ruled out: Secondary | ICD-10-CM | POA: Diagnosis not present

## 2019-10-05 DIAGNOSIS — Z03818 Encounter for observation for suspected exposure to other biological agents ruled out: Secondary | ICD-10-CM | POA: Diagnosis not present

## 2019-10-09 DIAGNOSIS — Z03818 Encounter for observation for suspected exposure to other biological agents ruled out: Secondary | ICD-10-CM | POA: Diagnosis not present

## 2019-10-13 DIAGNOSIS — Z03818 Encounter for observation for suspected exposure to other biological agents ruled out: Secondary | ICD-10-CM | POA: Diagnosis not present

## 2019-10-16 DIAGNOSIS — Z03818 Encounter for observation for suspected exposure to other biological agents ruled out: Secondary | ICD-10-CM | POA: Diagnosis not present

## 2019-10-20 DIAGNOSIS — Z03818 Encounter for observation for suspected exposure to other biological agents ruled out: Secondary | ICD-10-CM | POA: Diagnosis not present

## 2019-10-23 DIAGNOSIS — Z03818 Encounter for observation for suspected exposure to other biological agents ruled out: Secondary | ICD-10-CM | POA: Diagnosis not present

## 2019-10-27 DIAGNOSIS — Z03818 Encounter for observation for suspected exposure to other biological agents ruled out: Secondary | ICD-10-CM | POA: Diagnosis not present

## 2019-10-30 DIAGNOSIS — Z03818 Encounter for observation for suspected exposure to other biological agents ruled out: Secondary | ICD-10-CM | POA: Diagnosis not present

## 2019-11-03 DIAGNOSIS — Z03818 Encounter for observation for suspected exposure to other biological agents ruled out: Secondary | ICD-10-CM | POA: Diagnosis not present

## 2019-11-06 DIAGNOSIS — Z03818 Encounter for observation for suspected exposure to other biological agents ruled out: Secondary | ICD-10-CM | POA: Diagnosis not present

## 2019-11-10 DIAGNOSIS — Z03818 Encounter for observation for suspected exposure to other biological agents ruled out: Secondary | ICD-10-CM | POA: Diagnosis not present

## 2019-11-13 DIAGNOSIS — Z03818 Encounter for observation for suspected exposure to other biological agents ruled out: Secondary | ICD-10-CM | POA: Diagnosis not present

## 2019-11-17 DIAGNOSIS — Z03818 Encounter for observation for suspected exposure to other biological agents ruled out: Secondary | ICD-10-CM | POA: Diagnosis not present

## 2019-11-20 DIAGNOSIS — Z03818 Encounter for observation for suspected exposure to other biological agents ruled out: Secondary | ICD-10-CM | POA: Diagnosis not present

## 2019-11-24 DIAGNOSIS — Z03818 Encounter for observation for suspected exposure to other biological agents ruled out: Secondary | ICD-10-CM | POA: Diagnosis not present

## 2019-12-01 DIAGNOSIS — Z03818 Encounter for observation for suspected exposure to other biological agents ruled out: Secondary | ICD-10-CM | POA: Diagnosis not present

## 2019-12-04 DIAGNOSIS — N898 Other specified noninflammatory disorders of vagina: Secondary | ICD-10-CM | POA: Diagnosis not present

## 2019-12-04 DIAGNOSIS — Z4689 Encounter for fitting and adjustment of other specified devices: Secondary | ICD-10-CM | POA: Diagnosis not present

## 2019-12-08 DIAGNOSIS — Z03818 Encounter for observation for suspected exposure to other biological agents ruled out: Secondary | ICD-10-CM | POA: Diagnosis not present

## 2019-12-15 DIAGNOSIS — Z03818 Encounter for observation for suspected exposure to other biological agents ruled out: Secondary | ICD-10-CM | POA: Diagnosis not present

## 2019-12-22 DIAGNOSIS — Z03818 Encounter for observation for suspected exposure to other biological agents ruled out: Secondary | ICD-10-CM | POA: Diagnosis not present

## 2019-12-29 DIAGNOSIS — Z03818 Encounter for observation for suspected exposure to other biological agents ruled out: Secondary | ICD-10-CM | POA: Diagnosis not present

## 2020-01-05 DIAGNOSIS — Z03818 Encounter for observation for suspected exposure to other biological agents ruled out: Secondary | ICD-10-CM | POA: Diagnosis not present

## 2020-01-12 DIAGNOSIS — Z03818 Encounter for observation for suspected exposure to other biological agents ruled out: Secondary | ICD-10-CM | POA: Diagnosis not present

## 2020-01-18 DIAGNOSIS — M79671 Pain in right foot: Secondary | ICD-10-CM | POA: Diagnosis not present

## 2020-01-18 DIAGNOSIS — M7671 Peroneal tendinitis, right leg: Secondary | ICD-10-CM | POA: Diagnosis not present

## 2020-01-19 DIAGNOSIS — Z03818 Encounter for observation for suspected exposure to other biological agents ruled out: Secondary | ICD-10-CM | POA: Diagnosis not present

## 2020-01-26 DIAGNOSIS — Z03818 Encounter for observation for suspected exposure to other biological agents ruled out: Secondary | ICD-10-CM | POA: Diagnosis not present

## 2020-02-01 DIAGNOSIS — N760 Acute vaginitis: Secondary | ICD-10-CM | POA: Diagnosis not present

## 2020-02-01 DIAGNOSIS — N816 Rectocele: Secondary | ICD-10-CM | POA: Diagnosis not present

## 2020-02-01 DIAGNOSIS — R102 Pelvic and perineal pain: Secondary | ICD-10-CM | POA: Diagnosis not present

## 2020-02-01 DIAGNOSIS — B9689 Other specified bacterial agents as the cause of diseases classified elsewhere: Secondary | ICD-10-CM | POA: Diagnosis not present

## 2020-02-01 DIAGNOSIS — N898 Other specified noninflammatory disorders of vagina: Secondary | ICD-10-CM | POA: Diagnosis not present

## 2020-02-01 DIAGNOSIS — N8111 Cystocele, midline: Secondary | ICD-10-CM | POA: Diagnosis not present

## 2020-02-01 DIAGNOSIS — R300 Dysuria: Secondary | ICD-10-CM | POA: Diagnosis not present

## 2020-02-01 DIAGNOSIS — N814 Uterovaginal prolapse, unspecified: Secondary | ICD-10-CM | POA: Diagnosis not present

## 2020-02-12 DIAGNOSIS — Z4689 Encounter for fitting and adjustment of other specified devices: Secondary | ICD-10-CM | POA: Diagnosis not present

## 2020-02-12 DIAGNOSIS — N814 Uterovaginal prolapse, unspecified: Secondary | ICD-10-CM | POA: Diagnosis not present

## 2020-02-12 DIAGNOSIS — N898 Other specified noninflammatory disorders of vagina: Secondary | ICD-10-CM | POA: Diagnosis not present

## 2020-02-12 DIAGNOSIS — R102 Pelvic and perineal pain: Secondary | ICD-10-CM | POA: Diagnosis not present

## 2020-02-12 DIAGNOSIS — N8111 Cystocele, midline: Secondary | ICD-10-CM | POA: Diagnosis not present

## 2020-02-12 DIAGNOSIS — N816 Rectocele: Secondary | ICD-10-CM | POA: Diagnosis not present

## 2020-03-11 DIAGNOSIS — M1611 Unilateral primary osteoarthritis, right hip: Secondary | ICD-10-CM | POA: Diagnosis not present

## 2020-03-11 DIAGNOSIS — M25551 Pain in right hip: Secondary | ICD-10-CM | POA: Diagnosis not present

## 2020-03-28 DIAGNOSIS — K219 Gastro-esophageal reflux disease without esophagitis: Secondary | ICD-10-CM | POA: Diagnosis not present

## 2020-03-28 DIAGNOSIS — Z0000 Encounter for general adult medical examination without abnormal findings: Secondary | ICD-10-CM | POA: Diagnosis not present

## 2020-03-28 DIAGNOSIS — E7800 Pure hypercholesterolemia, unspecified: Secondary | ICD-10-CM | POA: Diagnosis not present

## 2020-03-28 DIAGNOSIS — R42 Dizziness and giddiness: Secondary | ICD-10-CM | POA: Diagnosis not present

## 2020-03-28 DIAGNOSIS — R1013 Epigastric pain: Secondary | ICD-10-CM | POA: Diagnosis not present

## 2020-03-28 DIAGNOSIS — I10 Essential (primary) hypertension: Secondary | ICD-10-CM | POA: Diagnosis not present

## 2020-03-28 DIAGNOSIS — R194 Change in bowel habit: Secondary | ICD-10-CM | POA: Diagnosis not present

## 2020-03-28 DIAGNOSIS — N3281 Overactive bladder: Secondary | ICD-10-CM | POA: Diagnosis not present

## 2020-03-28 DIAGNOSIS — M159 Polyosteoarthritis, unspecified: Secondary | ICD-10-CM | POA: Diagnosis not present

## 2020-04-21 ENCOUNTER — Other Ambulatory Visit: Payer: Self-pay

## 2020-04-21 ENCOUNTER — Other Ambulatory Visit
Admission: RE | Admit: 2020-04-21 | Discharge: 2020-04-21 | Disposition: A | Discharge status: Home / Self Care | Payer: Medicare HMO | Source: Ambulatory Visit | Attending: Gastroenterology | Admitting: Gastroenterology

## 2020-04-21 DIAGNOSIS — Z20822 Contact with and (suspected) exposure to covid-19: Secondary | ICD-10-CM | POA: Diagnosis not present

## 2020-04-21 DIAGNOSIS — Z01812 Encounter for preprocedural laboratory examination: Secondary | ICD-10-CM | POA: Insufficient documentation

## 2020-04-21 LAB — SARS CORONAVIRUS 2 (TAT 6-24 HRS): SARS Coronavirus 2: NEGATIVE

## 2020-04-22 ENCOUNTER — Encounter: Payer: Self-pay | Admitting: *Deleted

## 2020-04-25 ENCOUNTER — Ambulatory Visit: Payer: Medicare HMO | Admitting: Anesthesiology

## 2020-04-25 ENCOUNTER — Other Ambulatory Visit: Payer: Self-pay

## 2020-04-25 ENCOUNTER — Ambulatory Visit
Admission: RE | Admit: 2020-04-25 | Discharge: 2020-04-25 | Disposition: A | Discharge status: Home / Self Care | Payer: Medicare HMO | Attending: Gastroenterology | Admitting: Gastroenterology

## 2020-04-25 ENCOUNTER — Encounter
Admission: RE | Disposition: A | Discharge status: Home / Self Care | Payer: Medicare HMO | Source: Home / Self Care | Attending: Gastroenterology

## 2020-04-25 ENCOUNTER — Encounter: Payer: Self-pay | Admitting: *Deleted

## 2020-04-25 DIAGNOSIS — R1013 Epigastric pain: Secondary | ICD-10-CM | POA: Diagnosis not present

## 2020-04-25 DIAGNOSIS — E785 Hyperlipidemia, unspecified: Secondary | ICD-10-CM | POA: Diagnosis not present

## 2020-04-25 DIAGNOSIS — M1990 Unspecified osteoarthritis, unspecified site: Secondary | ICD-10-CM | POA: Diagnosis not present

## 2020-04-25 DIAGNOSIS — Z79899 Other long term (current) drug therapy: Secondary | ICD-10-CM | POA: Diagnosis not present

## 2020-04-25 DIAGNOSIS — I491 Atrial premature depolarization: Secondary | ICD-10-CM | POA: Diagnosis not present

## 2020-04-25 DIAGNOSIS — I10 Essential (primary) hypertension: Secondary | ICD-10-CM | POA: Diagnosis not present

## 2020-04-25 DIAGNOSIS — K219 Gastro-esophageal reflux disease without esophagitis: Secondary | ICD-10-CM | POA: Diagnosis not present

## 2020-04-25 DIAGNOSIS — R000 Tachycardia, unspecified: Secondary | ICD-10-CM | POA: Diagnosis not present

## 2020-04-25 DIAGNOSIS — I4891 Unspecified atrial fibrillation: Secondary | ICD-10-CM | POA: Insufficient documentation

## 2020-04-25 DIAGNOSIS — R002 Palpitations: Secondary | ICD-10-CM | POA: Diagnosis not present

## 2020-04-25 HISTORY — DX: Dizziness and giddiness: R42

## 2020-04-25 SURGERY — COLONOSCOPY WITH PROPOFOL
Anesthesia: General

## 2020-04-25 MED ORDER — SODIUM CHLORIDE 0.9 % IV SOLN: INTRAVENOUS | Status: DC

## 2020-04-25 MED ORDER — SODIUM CHLORIDE 0.9 % IV SOLN: INTRAVENOUS | Status: DC | PRN

## 2020-04-25 NOTE — Transfer of Care (Signed)
Immediate Anesthesia Transfer of Care Note  Patient: Kaitlin Wilson  Procedure(s) Performed: COLONOSCOPY WITH PROPOFOL (N/A ) ESOPHAGOGASTRODUODENOSCOPY (EGD) WITH PROPOFOL (N/A )  Patient Location: PACU and Endoscopy Unit  Anesthesia Type:NO anesthesia given due to rapid heart rate upon arrival  Level of Consciousness: awake, alert  and oriented  Airway & Oxygen Therapy: Patient Spontanous Breathing and Patient connected to nasal cannula oxygen  Post-op Assessment:   Post vital signs: Reviewed  Last Vitals:  Vitals Value Taken Time  BP 131/87 04/25/20 1444  Temp    Pulse 87 04/25/20 1445  Resp 21 04/25/20 1444  SpO2 98 % 04/25/20 1445  Vitals shown include unvalidated device data.  Last Pain:  Vitals:   04/25/20 1357  TempSrc:   PainSc: (P) 0-No pain         Complications: No complications documented.

## 2020-04-25 NOTE — Progress Notes (Signed)
Patient brought to post op to be monitored due to a new on set of heart rhythm. Dr. Lubertha Basque evaluated the patient and ordered ECG. Dr. Lubertha Basque notified when ECG was done and asked for a Cardiology consult. Monitored patient while waiting for cardiology. Cardiology came and wanted the patient to get evaluated at the clinic within the week for the new onset heart rhythm. Cardiology recommended that the EGD and colonoscopy be put off for now. Cardiology also placed a heart monitor on the patient for the next 72 hours with instructions and a follow up appointment.

## 2020-04-25 NOTE — Consult Note (Signed)
CARDIOLOGY CONSULT NOTE               Patient ID: Kaitlin Wilson MRN: [id number] DOB/AGE: 70-Jul-1952 40 y.o.  Admit date: 04/25/2020 Referring Physician [place] Primary Physician [place] Primary Cardiologist none per patient Reason for Consultation atrial fibrillation with RVR  HPI: 69 year old female referred for evaluation of atrial fibrillation with RVR. The patient has a history of GERD, hyperlipidemia, hypertension. The patient was scheduled for a colonoscopy today. She reported to the nursing staff prior to coming to the endoscopy suite that she had a headache and right sided chest discomfort, which she attributed to acid reflux. When the patient was placed on telemetry in the endoscopy suite prior to the procedure and prior to sedation, the patient was noted to be in atrial fibrillation with rates in the 170s. The patient did report mild right sided chest discomfort. The patient converted to sinus rhythm promptly without intervention, and ECG that was obtained was after the atrial fibrillation had resolved. ECG revealed sinus rhythm at a rate of with PACs with nonspecific ST-T wave abnormalities, and prior anterior infarct, age undetermined. The patient states that she has experienced this type of chest pain for about 6 months. She states that if she presses down on her chest on the right side, it illicits belching, and her chest discomfort resolves. She takes Mylanta, which also helps. 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 She denies shortness of breath, palpitations, peripheral edema, presyncope or syncope. Currently, she reports feeling fine. She denies a known history of MI, stroke, heart failure, DVT/PE, diabetes, known arrhythmia, or major bleed.   Review of systems complete and found to be negative unless listed above     Past Medical History:  Diagnosis Date  . Arthritis   . GERD (gastroesophageal reflux disease)     . Hypertension   . Vertigo     History reviewed. No pertinent surgical history.  Facility-Administered Medications Prior to Admission  Medication Dose Route Frequency Provider Last Rate Last Admin  . betamethasone acetate-betamethasone sodium phosphate (CELESTONE) injection 3 mg  3 mg Intramuscular Once Edrick Kins, DPM       Medications Prior to Admission  Medication Sig Dispense Refill Last Dose  . Cholecalciferol (VITAMIN D) 2000 units tablet Take by mouth.   Past Week at Unknown time  . losartan (COZAAR) 25 MG tablet TAKE 1 TABLET (25 MG TOTAL) BY MOUTH ONCE DAILY.   Past Week at Unknown time  . meclizine (ANTIVERT) 12.5 MG tablet Take 12.5 mg by mouth 3 (three) times daily as needed for dizziness.   Past Month at Unknown time  . naproxen sodium (ALEVE) 220 MG tablet Take 220 mg by mouth 2 (two) times daily as needed.   Past Month at Unknown time  . fluconazole (DIFLUCAN) 150 MG tablet Take one tab for symptoms of yeast infection. Repeat x 1 in 72 hours. 2 tablet 0   . metroNIDAZOLE (FLAGYL) 500 MG tablet Take 1 tablet (500 mg total) by mouth 2 (two) times daily. (Patient not taking: Reported on 04/25/2020) 14 tablet 0 Not Taking at Unknown time  . NONFORMULARY OR COMPOUNDED ITEM Apply 1-2 g topically 4 (four) times daily. 120 each 2    Social History   Socioeconomic History  . Marital status: Single    Spouse name: Not on file  . Number of children: Not on file  . Years of education: Not on file  . Highest education  level: Not on file  Occupational History  . Not on file  Tobacco Use  . Smoking status: Never Smoker  . Smokeless tobacco: Never Used  Vaping Use  . Vaping Use: Never used  Substance and Sexual Activity  . Alcohol use: No  . Drug use: No  . Sexual activity: Not on file  Other Topics Concern  . Not on file  Social History Narrative  . Not on file   Social Determinants of Health   Financial Resource Strain:   . Difficulty of Paying Living Expenses:    Food Insecurity:   . Worried About Charity fundraiser in the Last Year:   . Arboriculturist in the Last Year:   Transportation Needs:   . Film/video editor (Medical):   Marland Kitchen Lack of Transportation (Non-Medical):   Physical Activity:   . Days of Exercise per Week:   . Minutes of Exercise per Session:   Stress:   . Feeling of Stress :   Social Connections:   . Frequency of Communication with Friends and Family:   . Frequency of Social Gatherings with Friends and Family:   . Attends Religious Services:   . Active Member of Clubs or Organizations:   . Attends Archivist Meetings:   Marland Kitchen Marital Status:   Intimate Partner Violence:   . Fear of Current or Ex-Partner:   . Emotionally Abused:   Marland Kitchen Physically Abused:   . Sexually Abused:     Family History  Problem Relation Age of Onset  . Stroke Father   . Kidney cancer Neg Hx   . Bladder Cancer Neg Hx       Review of systems complete and found to be negative unless listed above      PHYSICAL EXAM  General: Well developed, well nourished, in no acute distress HEENT:  Normocephalic and atramatic Neck:  No JVD.  Lungs: Clear bilaterally to auscultation, normal effort of breathing on room air. Heart: HRRR . Normal S1 and S2 without gallops or murmurs.  Abdomen: nondistended Msk:  Back normal,gait not assessed. Normal strength and tone for age. Extremities: No clubbing, cyanosis or edema.   Neuro: Alert and oriented X 3. Psych:  Good affect, responds appropriately  Labs:   Lab Results  Component Value Date   WBC 6.1 09/05/2014   HGB 14.6 09/05/2014   HCT 42.0 09/05/2014   MCV 91 09/05/2014   PLT 206 09/05/2014   No results for input(s): NA, K, CL, CO2, BUN, CREATININE, CALCIUM, PROT, BILITOT, ALKPHOS, ALT, AST, GLUCOSE in the last 168 hours.  Invalid input(s): LABALBU Lab Results  Component Value Date   TROPONINI < 0.02 09/05/2014   No results found for: CHOL No results found for: HDL No results found  for: LDLCALC No results found for: TRIG No results found for: CHOLHDL No results found for: LDLDIRECT    Radiology: No results found.  EKG: sinus rhythm, nonspecific ST-T wave abnormalities  ASSESSMENT AND PLAN:  1. Atrial fibrillation with RVR, with no known prior history, while awaiting scheduled colonoscopy today. The patient converted to sinus rhythm quickly without intervention. Chads vasc score of 3. 2. Chest pain, occurs at rest, relieved with palpation and taking Mylanta, could be secondary to GERD, as well as episodes of atrial fibrillation. 3. Hypertension, on losartan  Recommendations: 1. 72-hr Holter monitor placed today. The patient is scheduled for initial visit and to review results on 8/6 2. 2D echocardiogram to be ordered as outpatient  3. Will obtain thyroid panel as outpatient  4. Recommend postponing colonoscopy today pending outpatient cardiac work-up.    Signed: Clabe Seal PA-C 04/25/2020, 2:56 PM

## 2020-04-25 NOTE — OR Nursing (Signed)
Patient brought into the procedure room and monitors attached at 1321.  CRNA Shirlee Limerick noticed the Pt's HR in the 170s-180s range and called Dr Lubertha Basque.  After Dr Lubertha Basque arrived and spoke with the patient, it was decided to postpone the procedure until an EKG can be done.  Patient was taken out to Post Procedure room 8 where she was put back on monitors.  EKG orders were put in by Dr Lubertha Basque.

## 2020-04-25 NOTE — H&P (Signed)
Outpatient short stay form Pre-procedure 04/25/2020 1:16 PM Raylene Miyamoto MD, MPH  Primary Physician: Dr. Ellison Hughs  Reason for visit:  Heartburn/Change in bowel habits/Screening  History of present illness:   69 y/o lady here for EGD/Colonoscopy for GERD/change in bowel habits/and screening. No family history of GI malignancies. No abdominal surgeries. No blood thinners.    Current Facility-Administered Medications:  .  0.9 %  sodium chloride infusion, , Intravenous, Continuous, Macel Yearsley, Hilton Cork, MD, Last Rate: 20 mL/hr at 04/25/20 1250, New Bag at 04/25/20 1250  Facility-Administered Medications Ordered in Other Encounters:  .  0.9 %  sodium chloride infusion, , Intravenous, Continuous PRN, Gayland Curry, CRNA, New Bag at 04/25/20 1300  Facility-Administered Medications Prior to Admission  Medication Dose Route Frequency Provider Last Rate Last Admin  . betamethasone acetate-betamethasone sodium phosphate (CELESTONE) injection 3 mg  3 mg Intramuscular Once Edrick Kins, DPM       Medications Prior to Admission  Medication Sig Dispense Refill Last Dose  . Cholecalciferol (VITAMIN D) 2000 units tablet Take by mouth.   Past Week at Unknown time  . losartan (COZAAR) 25 MG tablet TAKE 1 TABLET (25 MG TOTAL) BY MOUTH ONCE DAILY.   Past Week at Unknown time  . meclizine (ANTIVERT) 12.5 MG tablet Take 12.5 mg by mouth 3 (three) times daily as needed for dizziness.   Past Month at Unknown time  . naproxen sodium (ALEVE) 220 MG tablet Take 220 mg by mouth 2 (two) times daily as needed.   Past Month at Unknown time  . fluconazole (DIFLUCAN) 150 MG tablet Take one tab for symptoms of yeast infection. Repeat x 1 in 72 hours. 2 tablet 0   . metroNIDAZOLE (FLAGYL) 500 MG tablet Take 1 tablet (500 mg total) by mouth 2 (two) times daily. (Patient not taking: Reported on 04/25/2020) 14 tablet 0 Not Taking at Unknown time  . NONFORMULARY OR COMPOUNDED ITEM Apply 1-2 g topically 4 (four)  times daily. 120 each 2      Allergies  Allergen Reactions  . Rosuvastatin      Past Medical History:  Diagnosis Date  . Arthritis   . GERD (gastroesophageal reflux disease)   . Hypertension   . Vertigo     Review of systems:  Otherwise negative.    Physical Exam  Gen: Alert, oriented. Appears stated age.  HEENT: [place]/AT. PERRLA. Lungs: No respiratory distress Abd: soft, benign, no masses. Ext: No edema. Pulses 2+    Planned procedures: Proceed with EGD/colonoscopy. The patient understands the nature of the planned procedure, indications, risks, alternatives and potential complications including but not limited to bleeding, infection, perforation, damage to internal organs and possible oversedation/side effects from anesthesia. The patient agrees and gives consent to proceed.  Please refer to procedure notes for findings, recommendations and patient disposition/instructions.     Raylene Miyamoto MD, MPH Gastroenterology 04/25/2020  1:16 PM

## 2020-04-25 NOTE — Anesthesia Preprocedure Evaluation (Addendum)
Anesthesia Evaluation  Patient identified by MRN, date of birth, ID band Patient awake    Reviewed: Allergy & Precautions, H&P , NPO status , Patient's Chart, lab work & pertinent test results  Airway Mallampati: II       Dental   Pulmonary neg pulmonary ROS,           Cardiovascular hypertension,      Neuro/Psych negative neurological ROS  negative psych ROS   GI/Hepatic Neg liver ROS, GERD  ,  Endo/Other  negative endocrine ROS  Renal/GU negative Renal ROS  negative genitourinary   Musculoskeletal   Abdominal   Peds  Hematology negative hematology ROS (+)   Anesthesia Other Findings Past Medical History: No date: Arthritis No date: GERD (gastroesophageal reflux disease) No date: Hypertension No date: Vertigo  History reviewed. No pertinent surgical history.  BMI    Body Mass Index: 28.63 kg/m      Reproductive/Obstetrics negative OB ROS                            Anesthesia Physical Anesthesia Plan  ASA: II  Anesthesia Plan: General   Post-op Pain Management:    Induction:   PONV Risk Score and Plan: Propofol infusion and TIVA  Airway Management Planned: Nasal Cannula  Additional Equipment:   Intra-op Plan:   Post-operative Plan:   Informed Consent: I have reviewed the patients History and Physical, chart, labs and discussed the procedure including the risks, benefits and alternatives for the proposed anesthesia with the patient or authorized representative who has indicated his/her understanding and acceptance.     Dental Advisory Given  Plan Discussed with: Anesthesiologist, CRNA and Surgeon  Anesthesia Plan Comments:         Anesthesia Quick Evaluation

## 2020-05-04 DIAGNOSIS — M2012 Hallux valgus (acquired), left foot: Secondary | ICD-10-CM | POA: Diagnosis not present

## 2020-05-04 DIAGNOSIS — S99922A Unspecified injury of left foot, initial encounter: Secondary | ICD-10-CM | POA: Diagnosis not present

## 2020-05-06 DIAGNOSIS — R002 Palpitations: Secondary | ICD-10-CM | POA: Diagnosis not present

## 2020-05-06 DIAGNOSIS — Z4689 Encounter for fitting and adjustment of other specified devices: Secondary | ICD-10-CM | POA: Diagnosis not present

## 2020-05-06 DIAGNOSIS — I10 Essential (primary) hypertension: Secondary | ICD-10-CM | POA: Diagnosis not present

## 2020-05-06 DIAGNOSIS — I480 Paroxysmal atrial fibrillation: Secondary | ICD-10-CM | POA: Diagnosis not present

## 2020-05-19 DIAGNOSIS — Z03818 Encounter for observation for suspected exposure to other biological agents ruled out: Secondary | ICD-10-CM | POA: Diagnosis not present

## 2020-05-25 DIAGNOSIS — Z03818 Encounter for observation for suspected exposure to other biological agents ruled out: Secondary | ICD-10-CM | POA: Diagnosis not present

## 2020-06-02 DIAGNOSIS — Z03818 Encounter for observation for suspected exposure to other biological agents ruled out: Secondary | ICD-10-CM | POA: Diagnosis not present

## 2020-06-09 DIAGNOSIS — Z03818 Encounter for observation for suspected exposure to other biological agents ruled out: Secondary | ICD-10-CM | POA: Diagnosis not present

## 2020-06-16 DIAGNOSIS — Z03818 Encounter for observation for suspected exposure to other biological agents ruled out: Secondary | ICD-10-CM | POA: Diagnosis not present

## 2020-06-17 DIAGNOSIS — I480 Paroxysmal atrial fibrillation: Secondary | ICD-10-CM | POA: Diagnosis not present

## 2020-06-20 DIAGNOSIS — R002 Palpitations: Secondary | ICD-10-CM | POA: Diagnosis not present

## 2020-06-20 DIAGNOSIS — I10 Essential (primary) hypertension: Secondary | ICD-10-CM | POA: Diagnosis not present

## 2020-06-20 DIAGNOSIS — I480 Paroxysmal atrial fibrillation: Secondary | ICD-10-CM | POA: Diagnosis not present

## 2020-06-23 DIAGNOSIS — Z03818 Encounter for observation for suspected exposure to other biological agents ruled out: Secondary | ICD-10-CM | POA: Diagnosis not present

## 2020-06-30 DIAGNOSIS — Z03818 Encounter for observation for suspected exposure to other biological agents ruled out: Secondary | ICD-10-CM | POA: Diagnosis not present

## 2020-07-07 DIAGNOSIS — Z03818 Encounter for observation for suspected exposure to other biological agents ruled out: Secondary | ICD-10-CM | POA: Diagnosis not present

## 2020-07-29 DIAGNOSIS — M85842 Other specified disorders of bone density and structure, left hand: Secondary | ICD-10-CM | POA: Diagnosis not present

## 2020-07-29 DIAGNOSIS — M79645 Pain in left finger(s): Secondary | ICD-10-CM | POA: Diagnosis not present

## 2020-07-29 DIAGNOSIS — M19042 Primary osteoarthritis, left hand: Secondary | ICD-10-CM | POA: Diagnosis not present

## 2020-07-29 DIAGNOSIS — M19032 Primary osteoarthritis, left wrist: Secondary | ICD-10-CM | POA: Diagnosis not present

## 2020-08-15 DIAGNOSIS — L821 Other seborrheic keratosis: Secondary | ICD-10-CM | POA: Diagnosis not present

## 2020-08-15 DIAGNOSIS — L820 Inflamed seborrheic keratosis: Secondary | ICD-10-CM | POA: Diagnosis not present

## 2020-08-15 DIAGNOSIS — L570 Actinic keratosis: Secondary | ICD-10-CM | POA: Diagnosis not present

## 2020-09-26 DIAGNOSIS — I10 Essential (primary) hypertension: Secondary | ICD-10-CM | POA: Diagnosis not present

## 2020-09-26 DIAGNOSIS — M159 Polyosteoarthritis, unspecified: Secondary | ICD-10-CM | POA: Diagnosis not present

## 2020-09-26 DIAGNOSIS — N3281 Overactive bladder: Secondary | ICD-10-CM | POA: Diagnosis not present

## 2020-09-26 DIAGNOSIS — K047 Periapical abscess without sinus: Secondary | ICD-10-CM | POA: Diagnosis not present

## 2020-09-26 DIAGNOSIS — G4700 Insomnia, unspecified: Secondary | ICD-10-CM | POA: Diagnosis not present

## 2020-09-26 DIAGNOSIS — K219 Gastro-esophageal reflux disease without esophagitis: Secondary | ICD-10-CM | POA: Diagnosis not present

## 2020-09-26 DIAGNOSIS — E7800 Pure hypercholesterolemia, unspecified: Secondary | ICD-10-CM | POA: Diagnosis not present

## 2020-09-26 DIAGNOSIS — R42 Dizziness and giddiness: Secondary | ICD-10-CM | POA: Diagnosis not present

## 2020-09-26 DIAGNOSIS — I480 Paroxysmal atrial fibrillation: Secondary | ICD-10-CM | POA: Diagnosis not present

## 2020-10-05 DIAGNOSIS — J069 Acute upper respiratory infection, unspecified: Secondary | ICD-10-CM | POA: Diagnosis not present

## 2020-10-28 DIAGNOSIS — A084 Viral intestinal infection, unspecified: Secondary | ICD-10-CM | POA: Diagnosis not present

## 2020-11-18 DIAGNOSIS — N898 Other specified noninflammatory disorders of vagina: Secondary | ICD-10-CM | POA: Diagnosis not present

## 2020-11-18 DIAGNOSIS — N760 Acute vaginitis: Secondary | ICD-10-CM | POA: Diagnosis not present

## 2020-11-18 DIAGNOSIS — B9689 Other specified bacterial agents as the cause of diseases classified elsewhere: Secondary | ICD-10-CM | POA: Diagnosis not present

## 2020-11-18 DIAGNOSIS — Z4689 Encounter for fitting and adjustment of other specified devices: Secondary | ICD-10-CM | POA: Diagnosis not present

## 2021-02-06 ENCOUNTER — Emergency Department
Admission: EM | Admit: 2021-02-06 | Discharge: 2021-02-06 | Disposition: A | Discharge status: Home / Self Care | Payer: Worker's Compensation | Attending: Emergency Medicine | Admitting: Emergency Medicine

## 2021-02-06 ENCOUNTER — Other Ambulatory Visit: Payer: Self-pay

## 2021-02-06 DIAGNOSIS — Y990 Civilian activity done for income or pay: Secondary | ICD-10-CM | POA: Diagnosis not present

## 2021-02-06 DIAGNOSIS — S0990XA Unspecified injury of head, initial encounter: Secondary | ICD-10-CM | POA: Diagnosis present

## 2021-02-06 DIAGNOSIS — Z79899 Other long term (current) drug therapy: Secondary | ICD-10-CM | POA: Diagnosis not present

## 2021-02-06 DIAGNOSIS — S0101XA Laceration without foreign body of scalp, initial encounter: Secondary | ICD-10-CM | POA: Insufficient documentation

## 2021-02-06 DIAGNOSIS — I10 Essential (primary) hypertension: Secondary | ICD-10-CM | POA: Diagnosis not present

## 2021-02-06 DIAGNOSIS — W07XXXA Fall from chair, initial encounter: Secondary | ICD-10-CM | POA: Diagnosis not present

## 2021-02-06 MED ORDER — NAPROXEN 500 MG PO TABS
500.0000 mg | ORAL_TABLET | Freq: Once | ORAL | Status: AC
  Administered 2021-02-06: 500 mg via ORAL
  Filled 2021-02-06: qty 1

## 2021-02-06 NOTE — ED Provider Notes (Signed)
[facility] Emergency Department Provider Note ____________________________________________   Event Date/Time   First MD Initiated Contact with Patient 02/06/21 1309     (approximate)  I have reviewed the triage vital signs and the nursing notes.   HISTORY  Chief Complaint Laceration  HPI Kaitlin Wilson is a 70 y.o. female with history as listed below presents to the emergency department for treatment and evaluation of laceration to the back of her head. She was going to sit in a rolling chair but missed it and hit her head on the desk. She has a 2 inch laceration to the posterior scalp.         Past Medical History:  Diagnosis Date  . Arthritis   . GERD (gastroesophageal reflux disease)   . Hypertension   . Vertigo     Patient Active Problem List   Diagnosis Date Noted  . Hypertension 02/20/2015    No past surgical history on file.  Prior to Admission medications   Medication Sig Start Date End Date Taking? Authorizing Provider  Cholecalciferol (VITAMIN D) 2000 units tablet Take by mouth.    [provider]  fluconazole (DIFLUCAN) 150 MG tablet Take one tab for symptoms of yeast infection. Repeat x 1 in 72 hours. 10/02/18   Lorin Picket, PA-C  losartan (COZAAR) 25 MG tablet TAKE 1 TABLET (25 MG TOTAL) BY MOUTH ONCE DAILY. 12/23/15   [provider]  meclizine (ANTIVERT) 12.5 MG tablet Take 12.5 mg by mouth 3 (three) times daily as needed for dizziness.    [provider]  naproxen sodium (ALEVE) 220 MG tablet Take 220 mg by mouth 2 (two) times daily as needed.    [provider]  NONFORMULARY OR COMPOUNDED ITEM Apply 1-2 g topically 4 (four) times daily. 08/17/16   Edrick Kins, DPM    Allergies Rosuvastatin  Family History  Problem Relation Age of Onset  . Stroke Father   . Kidney cancer Neg Hx   . Bladder Cancer Neg Hx     Social History Social History   Tobacco Use  . Smoking status:  Never Smoker  . Smokeless tobacco: Never Used  Vaping Use  . Vaping Use: Never used  Substance Use Topics  . Alcohol use: No  . Drug use: No    Review of Systems  Constitutional: No fever/chills Eyes: No visual changes. ENT: No sore throat. Cardiovascular: Denies chest pain. Respiratory: Denies shortness of breath. Gastrointestinal: No abdominal pain.  No nausea, no vomiting.  No diarrhea.  No constipation. Genitourinary: Negative for dysuria. Musculoskeletal: Negative for back pain. Skin: Positive for scalp laceration. Neurological: Negative for headaches, focal weakness or numbness. ____________________________________________   PHYSICAL EXAM:  VITAL SIGNS: ED Triage Vitals  Enc Vitals Group     BP 02/06/21 1317 (!) 148/78     Pulse Rate 02/06/21 1317 72     Resp 02/06/21 1317 18     Temp 02/06/21 1317 98 F (36.7 C)     Temp Source 02/06/21 1317 Oral     SpO2 02/06/21 1317 98 %     Weight 02/06/21 1228 139 lb (63 kg)     Height 02/06/21 1228 4\' 10"  (1.473 m)     Head Circumference --      Peak Flow --      Pain Score 02/06/21 1228 5     Pain Loc --      Pain Edu? --      Excl. in [place]? --  Constitutional: Alert and oriented. Well appearing and in no acute distress. Eyes: Conjunctivae are normal. PERRL. EOMI. Head: Atraumatic. Nose: No congestion/rhinnorhea. Mouth/Throat: Mucous membranes are moist.  Oropharynx non-erythematous. Neck: No stridor.   Hematological/Lymphatic/Immunilogical: No cervical lymphadenopathy. Cardiovascular: Normal rate, regular rhythm. Grossly normal heart sounds.  Good peripheral circulation. Respiratory: Normal respiratory effort.  No retractions. Lungs CTAB. Gastrointestinal: Soft and nontender. No distention. No abdominal bruits. No CVA tenderness. Genitourinary:  Musculoskeletal: No lower extremity tenderness nor edema.  No joint effusions. Neurologic:  Normal speech and language. No gross focal neurologic deficits are  appreciated. No gait instability. Skin: 4cm scalp laceration to posterior scalp without active bleeding . Psychiatric: Mood and affect are normal. Speech and behavior are normal.  ____________________________________________   LABS (all labs ordered are listed, but only abnormal results are displayed)  Labs Reviewed - No data to display ____________________________________________  EKG  N/a ____________________________________________  RADIOLOGY  ED MD interpretation:    Not indicated.  I, Sherrie George, personally viewed and evaluated these images (plain radiographs) as part of my medical decision making, as well as reviewing the written report by the radiologist.  Official radiology report(s): No results found.  ____________________________________________   PROCEDURES  Procedure(s) performed (including Critical Care):  Marland KitchenMarland KitchenLaceration Repair  Date/Time: 02/06/2021 4:00 PM Performed by: Victorino Dike, FNP Authorized by: Victorino Dike, FNP   Consent:    Consent obtained:  Verbal   Consent given by:  Patient   Risks discussed:  Poor cosmetic result Anesthesia:    Anesthesia method:  None Laceration details:    Location:  Scalp   Length (cm):  4 Treatment:    Area cleansed with:  Chlorhexidine and saline Skin repair:    Repair method:  Staples   Number of staples:  4 Approximation:    Approximation:  Close Repair type:    Repair type:  Simple Post-procedure details:    Dressing:  Open (no dressing)   Procedure completion:  Tolerated well, no immediate complications    ____________________________________________   INITIAL IMPRESSION / ASSESSMENT AND PLAN     70 year old female presenting to the emergency department for treatment and evaluation after she hit her head on a desk after missing the chair when trying to sit down.  See HPI for further details.  Incident was witnessed.  She did not lose consciousness.  She is not on blood  thinners   ED COURSE  Wound cleaned and stapled. Home care discussed. She will have staples removed either at primary care or return here. She will be given head injury instructions as well.   ___________________________________________   FINAL CLINICAL IMPRESSION(S) / ED DIAGNOSES  Final diagnoses:  Minor head injury, initial encounter  Scalp laceration, initial encounter     ED Discharge Orders    None       Kaitlin Wilson was evaluated in Emergency Department on 02/06/2021 for the symptoms described in the history of present illness. She was evaluated in the context of the global COVID-19 pandemic, which necessitated consideration that the patient might be at risk for infection with the SARS-CoV-2 virus that causes COVID-19. Institutional protocols and algorithms that pertain to the evaluation of patients at risk for COVID-19 are in a state of rapid change based on information released by regulatory bodies including the CDC and federal and state organizations. These policies and algorithms were followed during the patient's care in the ED.   Note:  This document was prepared using Systems analyst and may  include unintentional dictation errors.   Victorino Dike, FNP 02/06/21 1608    Lavonia Drafts, MD 02/07/21 1324

## 2021-02-06 NOTE — Discharge Instructions (Signed)
See primary care or return here in 1 week for staple removal.  Return to the ER for concerns about head injury.

## 2021-02-06 NOTE — ED Triage Notes (Signed)
Pt was in a rolling chair and slide out of it hitting the back of her head. Denies loc, states this is a worker's comp case and pt's supervisor Marquis Buggy who states not drug screen required

## 2021-02-06 NOTE — ED Notes (Signed)
See triage note   States she was at work  States she stood up and then missed the chair when she tried to sit down  Hit head on desk .  No LOC  Small laceration noted

## 2021-02-13 DIAGNOSIS — R1013 Epigastric pain: Secondary | ICD-10-CM | POA: Diagnosis not present

## 2021-02-13 DIAGNOSIS — Z1211 Encounter for screening for malignant neoplasm of colon: Secondary | ICD-10-CM | POA: Diagnosis not present

## 2021-03-08 DIAGNOSIS — Z1211 Encounter for screening for malignant neoplasm of colon: Secondary | ICD-10-CM | POA: Diagnosis not present

## 2021-03-27 DIAGNOSIS — E7800 Pure hypercholesterolemia, unspecified: Secondary | ICD-10-CM | POA: Diagnosis not present

## 2021-03-27 DIAGNOSIS — M159 Polyosteoarthritis, unspecified: Secondary | ICD-10-CM | POA: Diagnosis not present

## 2021-03-27 DIAGNOSIS — R42 Dizziness and giddiness: Secondary | ICD-10-CM | POA: Diagnosis not present

## 2021-03-27 DIAGNOSIS — K219 Gastro-esophageal reflux disease without esophagitis: Secondary | ICD-10-CM | POA: Diagnosis not present

## 2021-03-27 DIAGNOSIS — I10 Essential (primary) hypertension: Secondary | ICD-10-CM | POA: Diagnosis not present

## 2021-03-27 DIAGNOSIS — Z0000 Encounter for general adult medical examination without abnormal findings: Secondary | ICD-10-CM | POA: Diagnosis not present

## 2021-03-27 DIAGNOSIS — N3281 Overactive bladder: Secondary | ICD-10-CM | POA: Diagnosis not present

## 2021-03-27 DIAGNOSIS — I480 Paroxysmal atrial fibrillation: Secondary | ICD-10-CM | POA: Diagnosis not present

## 2021-03-27 DIAGNOSIS — Z1389 Encounter for screening for other disorder: Secondary | ICD-10-CM | POA: Diagnosis not present

## 2021-04-07 DIAGNOSIS — M25551 Pain in right hip: Secondary | ICD-10-CM | POA: Diagnosis not present

## 2021-04-07 DIAGNOSIS — M1611 Unilateral primary osteoarthritis, right hip: Secondary | ICD-10-CM | POA: Diagnosis not present

## 2021-05-05 DIAGNOSIS — N898 Other specified noninflammatory disorders of vagina: Secondary | ICD-10-CM | POA: Diagnosis not present

## 2021-05-05 DIAGNOSIS — Z4689 Encounter for fitting and adjustment of other specified devices: Secondary | ICD-10-CM | POA: Diagnosis not present

## 2021-05-19 DIAGNOSIS — D72819 Decreased white blood cell count, unspecified: Secondary | ICD-10-CM | POA: Diagnosis not present

## 2021-05-19 DIAGNOSIS — D696 Thrombocytopenia, unspecified: Secondary | ICD-10-CM | POA: Diagnosis not present

## 2021-05-28 DIAGNOSIS — M5442 Lumbago with sciatica, left side: Secondary | ICD-10-CM | POA: Diagnosis not present

## 2021-06-01 DIAGNOSIS — M5416 Radiculopathy, lumbar region: Secondary | ICD-10-CM | POA: Diagnosis not present

## 2021-06-01 DIAGNOSIS — M5431 Sciatica, right side: Secondary | ICD-10-CM | POA: Diagnosis not present

## 2021-06-06 ENCOUNTER — Emergency Department: Payer: Medicare HMO

## 2021-06-06 ENCOUNTER — Other Ambulatory Visit: Payer: Self-pay

## 2021-06-06 ENCOUNTER — Emergency Department
Admission: EM | Admit: 2021-06-06 | Discharge: 2021-06-06 | Disposition: A | Discharge status: Home / Self Care | Payer: Medicare HMO | Attending: Emergency Medicine | Admitting: Emergency Medicine

## 2021-06-06 DIAGNOSIS — I10 Essential (primary) hypertension: Secondary | ICD-10-CM | POA: Insufficient documentation

## 2021-06-06 DIAGNOSIS — X500XXA Overexertion from strenuous movement or load, initial encounter: Secondary | ICD-10-CM | POA: Diagnosis not present

## 2021-06-06 DIAGNOSIS — Z79899 Other long term (current) drug therapy: Secondary | ICD-10-CM | POA: Diagnosis not present

## 2021-06-06 DIAGNOSIS — M545 Low back pain, unspecified: Secondary | ICD-10-CM | POA: Diagnosis not present

## 2021-06-06 DIAGNOSIS — M5442 Lumbago with sciatica, left side: Secondary | ICD-10-CM | POA: Diagnosis not present

## 2021-06-06 MED ORDER — MELOXICAM 15 MG PO TABS: 15.0000 mg | ORAL_TABLET | Freq: Every day | ORAL | 2 refills | Status: DC

## 2021-06-06 MED ORDER — KETOROLAC TROMETHAMINE 60 MG/2ML IM SOLN
60.0000 mg | Freq: Once | INTRAMUSCULAR | Status: AC
  Administered 2021-06-06: 60 mg via INTRAMUSCULAR
  Filled 2021-06-06: qty 2

## 2021-06-06 MED ORDER — CYCLOBENZAPRINE HCL 10 MG PO TABS: 10.0000 mg | ORAL_TABLET | Freq: Three times a day (TID) | ORAL | 0 refills | Status: DC | PRN

## 2021-06-06 MED ORDER — CYCLOBENZAPRINE HCL 10 MG PO TABS
10.0000 mg | ORAL_TABLET | Freq: Once | ORAL | Status: AC
  Administered 2021-06-06: 10 mg via ORAL
  Filled 2021-06-06: qty 1

## 2021-06-06 MED ORDER — HYDROCODONE-ACETAMINOPHEN 5-325 MG PO TABS: 1.0000 | ORAL_TABLET | Freq: Four times a day (QID) | ORAL | 0 refills | Status: DC | PRN

## 2021-06-06 NOTE — ED Triage Notes (Signed)
Pt c/o lower back pain radiating into the buttock for the past 1-2 weeks, states she was seen at [facility] in [place] a week ago and given prednisone and meds with no relief.

## 2021-06-06 NOTE — ED Provider Notes (Signed)
[facility] Emergency Department Provider Note  ____________________________________________   Event Date/Time   First MD Initiated Contact with Patient 06/06/21 1007     (approximate)  I have reviewed the triage vital signs and the nursing notes.   HISTORY  Chief Complaint Back Pain    HPI Kaitlin Wilson is a 70 y.o. female  C/o low back pain for several day, known injury, patient lifted something heavy and started having lower back pain, pain is worse with movement, increased with bending over, denies numbness, tingling, or changes in bowel/urinary habits,  Using otc meds without relief, patient saw someone in urgent care and they gave her prednisone and muscle relaxer but she has had no relief Remainder ros neg   Past Medical History:  Diagnosis Date   Arthritis    GERD (gastroesophageal reflux disease)    Hypertension    Vertigo     Patient Active Problem List   Diagnosis Date Noted   Hypertension 02/20/2015    History reviewed. No pertinent surgical history.  Prior to Admission medications   Medication Sig Start Date End Date Taking? Authorizing Provider  cyclobenzaprine (FLEXERIL) 10 MG tablet Take 1 tablet (10 mg total) by mouth 3 (three) times daily as needed. 06/06/21  Yes Taevon Aschoff, Linden Dolin, PA-C  HYDROcodone-acetaminophen (NORCO/VICODIN) 5-325 MG tablet Take 1 tablet by mouth every 6 (six) hours as needed for moderate pain. 06/06/21  Yes Khloee Garza, Linden Dolin, PA-C  meloxicam (MOBIC) 15 MG tablet Take 1 tablet (15 mg total) by mouth daily. 06/06/21 06/06/22 Yes Ryshawn Sanzone, Linden Dolin, PA-C  Cholecalciferol (VITAMIN D) 2000 units tablet Take by mouth.    [provider]  fluconazole (DIFLUCAN) 150 MG tablet Take one tab for symptoms of yeast infection. Repeat x 1 in 72 hours. 10/02/18   Lorin Picket, PA-C  losartan (COZAAR) 25 MG tablet TAKE 1 TABLET (25 MG TOTAL) BY MOUTH ONCE DAILY. 12/23/15   [provider]  meclizine (ANTIVERT)  12.5 MG tablet Take 12.5 mg by mouth 3 (three) times daily as needed for dizziness.    [provider]  naproxen sodium (ALEVE) 220 MG tablet Take 220 mg by mouth 2 (two) times daily as needed.    [provider]  NONFORMULARY OR COMPOUNDED ITEM Apply 1-2 g topically 4 (four) times daily. 08/17/16   Edrick Kins, DPM    Allergies Rosuvastatin  Family History  Problem Relation Age of Onset   Stroke Father    Kidney cancer Neg Hx    Bladder Cancer Neg Hx     Social History Social History   Tobacco Use   Smoking status: Never   Smokeless tobacco: Never  Vaping Use   Vaping Use: Never used  Substance Use Topics   Alcohol use: No   Drug use: No    Review of Systems  Constitutional: No fever/chills Eyes: No visual changes. ENT: No sore throat. Respiratory: Denies cough Genitourinary: Negative for dysuria. Musculoskeletal: Positive for back pain. Skin: Negative for rash.    ____________________________________________   PHYSICAL EXAM:  VITAL SIGNS: ED Triage Vitals [06/06/21 0932]  Enc Vitals Group     BP (!) 143/70     Pulse Rate 69     Resp 17     Temp 98.2 F (36.8 C)     Temp Source Oral     SpO2 100 %     Weight      Height      Head Circumference  Peak Flow      Pain Score      Pain Loc      Pain Edu?      Excl. in [place]?     Constitutional: Alert and oriented. Well appearing and in no acute distress. Eyes: Conjunctivae are normal.  Head: Atraumatic. Nose: No congestion/rhinnorhea. Mouth/Throat: Mucous membranes are moist.   Neck:  supple no lymphadenopathy noted Cardiovascular: Normal rate, regular rhythm. Heart sounds are normal Respiratory: Normal respiratory effort.  No retractions, lungs c t a  Abd: soft nontender bs normal all 4 quad GU: deferred Musculoskeletal: FROM all extremities, warm and well perfused.  Decreased rom of back due to discomfort, lumbar spine mildly tender, negative slr, 5/5 strength in great toes  b/l, 5/5 strength in lower legs, n/v intact Neurologic:  Normal speech and language.  Skin:  Skin is warm, dry and intact. No rash noted. Psychiatric: Mood and affect are normal. Speech and behavior are normal.  ____________________________________________   LABS (all labs ordered are listed, but only abnormal results are displayed)  Labs Reviewed - No data to display ____________________________________________   ____________________________________________  RADIOLOGY  X-ray lumbar spine  ____________________________________________   PROCEDURES  Procedure(s) performed: Toradol 60 mg IM, Flexeril 10 mg.  Procedures    ____________________________________________   INITIAL IMPRESSION / ASSESSMENT AND PLAN / ED COURSE  Pertinent labs & imaging results that were available during my care of the patient were reviewed by me and considered in my medical decision making (see chart for details).   Patient 70 year old female presents with low back pain.  See HPI.  Physical exam shows patient to have reproduced pain with movement.  Tender along the SI joints and paravertebral muscles of the lumbar spine  X-ray lumbar spine reviewed by me confirmed by radiology to be negative for any acute abnormality  Pt had some relief with the medication but states she still hurts.  Will have her f/u with orthopedics as she does not have cauda equina symptoms or other severe symptoms of back pain.  Is in agreement with the treatment plan.  Discharged in stable condition    As part of my medical decision making, I reviewed the following data within the [place] notes reviewed and incorporated, Old chart reviewed, Radiograph reviewed , Notes from prior ED visits, and [place] Controlled Substance Database  ____________________________________________   FINAL CLINICAL IMPRESSION(S) / ED DIAGNOSES  Final diagnoses:  Acute midline low back pain with left-sided sciatica       NEW MEDICATIONS STARTED DURING THIS VISIT:  New Prescriptions   CYCLOBENZAPRINE (FLEXERIL) 10 MG TABLET    Take 1 tablet (10 mg total) by mouth 3 (three) times daily as needed.   HYDROCODONE-ACETAMINOPHEN (NORCO/VICODIN) 5-325 MG TABLET    Take 1 tablet by mouth every 6 (six) hours as needed for moderate pain.   MELOXICAM (MOBIC) 15 MG TABLET    Take 1 tablet (15 mg total) by mouth daily.     Note:  This document was prepared using Dragon voice recognition software and may include unintentional dictation errors.     Versie Starks, PA-C 06/06/21 1219    Carrie Mew, MD 06/06/21 [phone]

## 2021-06-06 NOTE — ED Notes (Signed)
This tech assisted pt to the restroom via wheelchair. Urine hat was placed. Small amount of urine was catch and sent to lab for any further orders if needed.

## 2021-06-06 NOTE — ED Notes (Signed)
Patient transported to X-ray 

## 2021-06-08 ENCOUNTER — Ambulatory Visit: Payer: Self-pay | Admitting: *Deleted

## 2021-06-08 NOTE — Telephone Encounter (Signed)
Pt called in c/o her left foot being swollen today.   She was seen in the ED yesterday for lower back pain and told she had a pulled muscle.   They gave her a shot and some prescriptions.     I suggested she see her PCP or return to the ED or go to the urgent care because I wasn't able to tell her what was wrong with her foot.   She said,   "Ok, I have to go". And ended the call.

## 2021-06-08 NOTE — Telephone Encounter (Signed)
Reason for Disposition  [1] Swollen foot AND [2] no fever  (Exceptions: localized bump from bunions, calluses, insect bite, sting)    I suggested she call her PCP or return to the ED.  Answer Assessment - Initial Assessment Questions 1. ONSET: "When did the pain start?"      Was seen in ED yesterday for back pain sciatica.   My left foot is swollen bad.   It wasn't yesterday.   I just noticed it.    I got a shot too.  I have a pulled muscle is what they told me. Denies any injuries to ankle or left foot.   It's just swollen.   Denies pain.   They told me I had a pulled muscle in my lower back. 6. OTHER SYMPTOMS: "Do you have any other symptoms?" (e.g., leg pain, rash, fever, numbness)     No other symptoms.   Denies injuries, twisting of ankle, no pain.   Maybe the medicine causing it to swell I don't know. 7. PREGNANCY: "Is there any chance you are pregnant?" "When was your last menstrual period?"     N/A  Protocols used: Foot Pain-A-AH

## 2021-06-09 DIAGNOSIS — N3281 Overactive bladder: Secondary | ICD-10-CM | POA: Diagnosis not present

## 2021-06-09 DIAGNOSIS — M5431 Sciatica, right side: Secondary | ICD-10-CM | POA: Diagnosis not present

## 2021-06-09 DIAGNOSIS — M5416 Radiculopathy, lumbar region: Secondary | ICD-10-CM | POA: Diagnosis not present

## 2021-06-09 DIAGNOSIS — R609 Edema, unspecified: Secondary | ICD-10-CM | POA: Diagnosis not present

## 2021-06-15 ENCOUNTER — Encounter: Payer: Self-pay | Admitting: *Deleted

## 2021-06-16 ENCOUNTER — Ambulatory Visit
Admission: RE | Admit: 2021-06-16 | Discharge: 2021-06-16 | Disposition: A | Discharge status: Home / Self Care | Payer: Medicare HMO | Attending: Gastroenterology | Admitting: Gastroenterology

## 2021-06-16 ENCOUNTER — Encounter
Admission: RE | Disposition: A | Discharge status: Home / Self Care | Payer: Self-pay | Source: Home / Self Care | Attending: Gastroenterology

## 2021-06-16 ENCOUNTER — Ambulatory Visit: Payer: Medicare HMO | Admitting: Certified Registered Nurse Anesthetist

## 2021-06-16 ENCOUNTER — Encounter: Payer: Self-pay | Admitting: *Deleted

## 2021-06-16 DIAGNOSIS — Z7952 Long term (current) use of systemic steroids: Secondary | ICD-10-CM | POA: Insufficient documentation

## 2021-06-16 DIAGNOSIS — K449 Diaphragmatic hernia without obstruction or gangrene: Secondary | ICD-10-CM | POA: Diagnosis not present

## 2021-06-16 DIAGNOSIS — R1013 Epigastric pain: Secondary | ICD-10-CM | POA: Diagnosis not present

## 2021-06-16 DIAGNOSIS — Z79899 Other long term (current) drug therapy: Secondary | ICD-10-CM | POA: Diagnosis not present

## 2021-06-16 DIAGNOSIS — I10 Essential (primary) hypertension: Secondary | ICD-10-CM | POA: Insufficient documentation

## 2021-06-16 HISTORY — PX: ESOPHAGOGASTRODUODENOSCOPY (EGD) WITH PROPOFOL: SHX5813

## 2021-06-16 HISTORY — DX: Hyperlipidemia, unspecified: E78.5

## 2021-06-16 SURGERY — ESOPHAGOGASTRODUODENOSCOPY (EGD) WITH PROPOFOL
Anesthesia: General

## 2021-06-16 MED ORDER — LIDOCAINE HCL (CARDIAC) PF 100 MG/5ML IV SOSY
PREFILLED_SYRINGE | INTRAVENOUS | Status: DC | PRN
  Administered 2021-06-16: 100 mg via INTRAVENOUS

## 2021-06-16 MED ORDER — SODIUM CHLORIDE 0.9 % IV SOLN: INTRAVENOUS | Status: DC

## 2021-06-16 MED ORDER — PROPOFOL 10 MG/ML IV BOLUS
INTRAVENOUS | Status: DC | PRN
  Administered 2021-06-16: 70 mg via INTRAVENOUS

## 2021-06-16 MED ORDER — PROPOFOL 500 MG/50ML IV EMUL
INTRAVENOUS | Status: DC | PRN
  Administered 2021-06-16: 150 ug/kg/min via INTRAVENOUS

## 2021-06-16 NOTE — Anesthesia Postprocedure Evaluation (Signed)
Anesthesia Post Note  Patient: Kaitlin Wilson  Procedure(s) Performed: ESOPHAGOGASTRODUODENOSCOPY (EGD) WITH PROPOFOL  Patient location during evaluation: Endoscopy Anesthesia Type: General Level of consciousness: awake and alert Pain management: pain level controlled Vital Signs Assessment: post-procedure vital signs reviewed and stable Respiratory status: spontaneous breathing, nonlabored ventilation, respiratory function stable and patient connected to nasal cannula oxygen Cardiovascular status: blood pressure returned to baseline and stable Postop Assessment: no apparent nausea or vomiting Anesthetic complications: no   No notable events documented.   Last Vitals:  Vitals:   06/16/21 1128 06/16/21 1138  BP: 110/74 100/81  Pulse: 72 73  Resp: 15 16  Temp:    SpO2: 99% 100%    Last Pain:  Vitals:   06/16/21 1138  TempSrc:   PainSc: 0-No pain                 Margaree Mackintosh

## 2021-06-16 NOTE — Anesthesia Preprocedure Evaluation (Signed)
Anesthesia Evaluation  Patient identified by MRN, date of birth, ID band Patient awake    Reviewed: Allergy & Precautions, NPO status , Patient's Chart, lab work & pertinent test results  History of Anesthesia Complications Negative for: history of anesthetic complications  Airway Mallampati: II  TM Distance: >3 FB Neck ROM: Full    Dental  (+) Poor Dentition, Missing   Pulmonary neg pulmonary ROS, neg sleep apnea, neg COPD,    breath sounds clear to auscultation- rhonchi (-) wheezing      Cardiovascular hypertension, Pt. on medications (-) CAD, (-) Past MI, (-) Cardiac Stents and (-) CABG  Rhythm:Regular Rate:Normal - Systolic murmurs and - Diastolic murmurs    Neuro/Psych neg Seizures negative neurological ROS  negative psych ROS   GI/Hepatic Neg liver ROS, GERD  ,  Endo/Other  negative endocrine ROSneg diabetes  Renal/GU negative Renal ROS     Musculoskeletal  (+) Arthritis ,   Abdominal (+) - obese,   Peds  Hematology negative hematology ROS (+)   Anesthesia Other Findings Past Medical History: No date: Arthritis No date: GERD (gastroesophageal reflux disease) No date: Hyperlipidemia No date: Hypertension No date: Vertigo No date: Vertigo   Reproductive/Obstetrics                             Anesthesia Physical Anesthesia Plan  ASA: 2  Anesthesia Plan: General   Post-op Pain Management:    Induction: Intravenous  PONV Risk Score and Plan: 2 and Propofol infusion  Airway Management Planned: Natural Airway  Additional Equipment:   Intra-op Plan:   Post-operative Plan:   Informed Consent: I have reviewed the patients History and Physical, chart, labs and discussed the procedure including the risks, benefits and alternatives for the proposed anesthesia with the patient or authorized representative who has indicated his/her understanding and acceptance.     Dental  advisory given  Plan Discussed with: CRNA and Anesthesiologist  Anesthesia Plan Comments:         Anesthesia Quick Evaluation

## 2021-06-16 NOTE — Anesthesia Procedure Notes (Signed)
Date/Time: 06/16/2021 11:07 AM Performed by: Lily Peer, [place]more Arabie, CRNA Pre-anesthesia Checklist: Patient identified, Emergency Drugs available, Suction available, Patient being monitored and Timeout performed Patient Re-evaluated:Patient Re-evaluated prior to induction Oxygen Delivery Method: Nasal cannula Induction Type: IV induction

## 2021-06-16 NOTE — H&P (Signed)
Outpatient short stay form Pre-procedure 06/16/2021  Kaitlin Rubenstein, MD  Primary Physician: Sofie Hartigan, MD  Reason for visit:  Dyspepsia  History of present illness:   70 y/o lady with history of hypertension here for EGD for burping/dyspeptic symptoms. No blood thinners. No smoking. No family history of GI malignancies.    Current Facility-Administered Medications:    0.9 %  sodium chloride infusion, , Intravenous, Continuous, Osker Ayoub, Hilton Cork, MD, Last Rate: 20 mL/hr at 06/16/21 1040, New Bag at 06/16/21 1040  Facility-Administered Medications Prior to Admission  Medication Dose Route Frequency Provider Last Rate Last Admin   betamethasone acetate-betamethasone sodium phosphate (CELESTONE) injection 3 mg  3 mg Intramuscular Once Edrick Kins, DPM       Medications Prior to Admission  Medication Sig Dispense Refill Last Dose   Cholecalciferol (VITAMIN D) 2000 units tablet Take by mouth.   06/15/2021   losartan (COZAAR) 25 MG tablet TAKE 1 TABLET (25 MG TOTAL) BY MOUTH ONCE DAILY.   06/15/2021   predniSONE (DELTASONE) 10 MG tablet Take 10 mg by mouth daily with breakfast.   06/15/2021   aspirin EC 81 MG tablet Take 81 mg by mouth daily. Swallow whole. (Patient not taking: Reported on 06/16/2021)   Not Taking   cyclobenzaprine (FLEXERIL) 10 MG tablet Take 1 tablet (10 mg total) by mouth 3 (three) times daily as needed. (Patient not taking: Reported on 06/16/2021) 30 tablet 0 Not Taking   fluconazole (DIFLUCAN) 150 MG tablet Take one tab for symptoms of yeast infection. Repeat x 1 in 72 hours. 2 tablet 0    HYDROcodone-acetaminophen (NORCO/VICODIN) 5-325 MG tablet Take 1 tablet by mouth every 6 (six) hours as needed for moderate pain. (Patient not taking: Reported on 06/16/2021) 8 tablet 0 Completed Course   meclizine (ANTIVERT) 12.5 MG tablet Take 12.5 mg by mouth 3 (three) times daily as needed for dizziness.      meloxicam (MOBIC) 15 MG tablet Take 1 tablet (15 mg total) by  mouth daily. (Patient not taking: Reported on 06/16/2021) 30 tablet 2 Not Taking   naproxen sodium (ALEVE) 220 MG tablet Take 220 mg by mouth 2 (two) times daily as needed.      NONFORMULARY OR COMPOUNDED ITEM Apply 1-2 g topically 4 (four) times daily. 120 each 2      Allergies  Allergen Reactions   Rosuvastatin      Past Medical History:  Diagnosis Date   Arthritis    GERD (gastroesophageal reflux disease)    Hyperlipidemia    Hypertension    Vertigo    Vertigo     Review of systems:  Otherwise negative.    Physical Exam  Gen: Alert, oriented. Appears stated age.  HEENT: PERRLA. Lungs: No respiratory distress CV: RRR Abd: soft, benign, no masses Ext: No edema   Planned procedures: Proceed with EGD. The patient understands the nature of the planned procedure, indications, risks, alternatives and potential complications including but not limited to bleeding, infection, perforation, damage to internal organs and possible oversedation/side effects from anesthesia. The patient agrees and gives consent to proceed.  Please refer to procedure notes for findings, recommendations and patient disposition/instructions.     Kaitlin Rubenstein, MD [facility] Gastroenterology

## 2021-06-16 NOTE — Transfer of Care (Signed)
Immediate Anesthesia Transfer of Care Note  Patient: Kaitlin Wilson  Procedure(s) Performed: ESOPHAGOGASTRODUODENOSCOPY (EGD) WITH PROPOFOL  Patient Location: Endoscopy Unit  Anesthesia Type:General  Level of Consciousness: drowsy  Airway & Oxygen Therapy: Patient Spontanous Breathing  Post-op Assessment: Report given to RN and Post -op Vital signs reviewed and stable  Post vital signs: Reviewed and stable  Last Vitals:  Vitals Value Taken Time  BP 114/68 06/16/21 1119  Temp 36.1 C 06/16/21 1118  Pulse 90 06/16/21 1120  Resp 21 06/16/21 1120  SpO2 95 % 06/16/21 1120  Vitals shown include unvalidated device data.  Last Pain:  Vitals:   06/16/21 1118  TempSrc: Temporal  PainSc: Asleep         Complications: No notable events documented.

## 2021-06-16 NOTE — Interval H&P Note (Signed)
History and Physical Interval Note:  06/16/2021 11:05 AM  Kaitlin Wilson  has presented today for surgery, with the diagnosis of DYSPEPSIA.  The various methods of treatment have been discussed with the patient and family. After consideration of risks, benefits and other options for treatment, the patient has consented to  Procedure(s): ESOPHAGOGASTRODUODENOSCOPY (EGD) WITH PROPOFOL (N/A) as a surgical intervention.  The patient's history has been reviewed, patient examined, no change in status, stable for surgery.  I have reviewed the patient's chart and labs.  Questions were answered to the patient's satisfaction.     Lesly Rubenstein  Ok to proceed with EGD

## 2021-06-16 NOTE — Op Note (Signed)
[facility] Gastroenterology Patient Name: Kaitlin Wilson Procedure Date: 06/16/2021 11:05 AM MRN: [id number] Account #: [id number] Date of Birth: [birth year] Admit Type: Outpatient Age: 70 Room: [place] ENDO ROOM 3 Gender: Female Note Status: Finalized Instrument Name: Upper Endoscope [id number] Procedure:             Upper GI endoscopy Indications:           Dyspepsia Providers:             Andrey Farmer MD, MD Referring MD:          Sofie Hartigan (Referring MD) Medicines:             Monitored Anesthesia Care Complications:         No immediate complications. Estimated blood loss:                         Minimal. Procedure:             Pre-Anesthesia Assessment:                        - Prior to the procedure, a History and Physical was                         performed, and patient medications and allergies were                         reviewed. The patient is competent. The risks and                         benefits of the procedure and the sedation options and                         risks were discussed with the patient. All questions                         were answered and informed consent was obtained.                         Patient identification and proposed procedure were                         verified by the physician, the nurse, the anesthetist                         and the technician in the endoscopy suite. Mental                         Status Examination: alert and oriented. Airway                         Examination: normal oropharyngeal airway and neck                         mobility. Respiratory Examination: clear to                         auscultation. CV Examination: normal. Prophylactic  Antibiotics: The patient does not require prophylactic                         antibiotics. Prior Anticoagulants: The patient has                         taken no previous anticoagulant or antiplatelet                          agents. ASA Grade Assessment: II - A patient with mild                         systemic disease. After reviewing the risks and                         benefits, the patient was deemed in satisfactory                         condition to undergo the procedure. The anesthesia                         plan was to use monitored anesthesia care (MAC).                         Immediately prior to administration of medications,                         the patient was re-assessed for adequacy to receive                         sedatives. The heart rate, respiratory rate, oxygen                         saturations, blood pressure, adequacy of pulmonary                         ventilation, and response to care were monitored                         throughout the procedure. The physical status of the                         patient was re-assessed after the procedure.                        After obtaining informed consent, the endoscope was                         passed under direct vision. Throughout the procedure,                         the patient's blood pressure, pulse, and oxygen                         saturations were monitored continuously. The Endoscope                         was introduced through the mouth, and advanced to the  second part of duodenum. The upper GI endoscopy was                         accomplished without difficulty. The patient tolerated                         the procedure well. Findings:      A 3 cm hiatal hernia was present.      The exam of the esophagus was otherwise normal.      The entire examined stomach was normal. Biopsies were taken with a cold       forceps for Helicobacter pylori testing. Estimated blood loss was       minimal.      The examined duodenum was normal. Impression:            - 3 cm hiatal hernia.                        - Normal stomach. Biopsied.                        - Normal examined  duodenum. Recommendation:        - Discharge patient to home.                        - Resume previous diet.                        - Continue present medications.                        - Await pathology results.                        - Return to referring physician as previously                         scheduled. Procedure Code(s):     --- Professional ---                        [phone], Esophagogastroduodenoscopy, flexible,                         transoral; with biopsy, single or multiple Diagnosis Code(s):     --- Professional ---                        K44.9, Diaphragmatic hernia without obstruction or                         gangrene                        R10.13, Epigastric pain CPT copyright 2019 [employer]. All rights reserved. The codes documented in this report are preliminary and upon coder review may  be revised to meet current compliance requirements. Andrey Farmer MD, MD 06/16/2021 11:19:49 AM Number of Addenda: 0 Note Initiated On: 06/16/2021 11:05 AM Estimated Blood Loss:  Estimated blood loss was minimal.      [facility]

## 2021-06-20 LAB — SURGICAL PATHOLOGY

## 2021-06-26 ENCOUNTER — Encounter: Payer: Self-pay | Admitting: Oncology

## 2021-06-26 ENCOUNTER — Inpatient Hospital Stay: Payer: Medicare HMO

## 2021-06-26 ENCOUNTER — Inpatient Hospital Stay: Payer: Medicare HMO | Attending: Oncology | Admitting: Oncology

## 2021-06-26 VITALS — BP 133/92 | HR 83 | Temp 98.9°F | Resp 17 | Wt 140.0 lb

## 2021-06-26 DIAGNOSIS — D708 Other neutropenia: Secondary | ICD-10-CM

## 2021-06-26 LAB — CBC WITH DIFFERENTIAL/PLATELET
Abs Immature Granulocytes: 0.03 10*3/uL (ref 0.00–0.07)
Basophils Absolute: 0 10*3/uL (ref 0.0–0.1)
Basophils Relative: 1 %
Eosinophils Absolute: 0.1 10*3/uL (ref 0.0–0.5)
Eosinophils Relative: 3 %
HCT: 43 % (ref 36.0–46.0)
Hemoglobin: 14.4 g/dL (ref 12.0–15.0)
Immature Granulocytes: 1 %
Lymphocytes Relative: 25 %
Lymphs Abs: 1.1 10*3/uL (ref 0.7–4.0)
MCH: 29.6 pg (ref 26.0–34.0)
MCHC: 33.5 g/dL (ref 30.0–36.0)
MCV: 88.3 fL (ref 80.0–100.0)
Monocytes Absolute: 0.2 10*3/uL (ref 0.1–1.0)
Monocytes Relative: 5 %
Neutro Abs: 2.9 10*3/uL (ref 1.7–7.7)
Neutrophils Relative %: 65 %
Platelets: 159 10*3/uL (ref 150–400)
RBC: 4.87 MIL/uL (ref 3.87–5.11)
RDW: 12.6 % (ref 11.5–15.5)
WBC: 4.3 10*3/uL (ref 4.0–10.5)
nRBC: 0 % (ref 0.0–0.2)

## 2021-06-26 LAB — TECHNOLOGIST SMEAR REVIEW
Plt Morphology: NORMAL
RBC MORPHOLOGY: NORMAL
WBC MORPHOLOGY: NORMAL

## 2021-06-26 LAB — FOLATE: Folate: 20.7 ng/mL (ref >5.9)

## 2021-06-26 LAB — LACTATE DEHYDROGENASE: LDH: 135 U/L (ref 98–192)

## 2021-06-26 LAB — HIV ANTIBODY (ROUTINE TESTING W REFLEX): HIV Screen 4th Generation wRfx: NONREACTIVE

## 2021-06-26 LAB — HEPATITIS C ANTIBODY: HCV Ab: NONREACTIVE

## 2021-06-26 LAB — VITAMIN B12: Vitamin B-12: 139 pg/mL — ABNORMAL LOW (ref 180–914)

## 2021-06-26 NOTE — Progress Notes (Signed)
Hematology/Oncology Consult note [facility] Telephone:([phone] [phone]  Patient Care Team: Sofie Hartigan, MD as PCP - General (Family Medicine)   Name of the patient: Kaitlin Wilson  [id number]  1950-10-03    Reason for referral-leukopenia   Referring physician-Dr. Ellison Hughs  Date of visit: 06/26/21   History of presenting illness-patient is a 70 year old female with a past medical history significant for hypertension hyperlipidemia GERD and arthritis referred for leukopenia.  Most recent CBC from 05/19/2021 showed a white count of 2.2, H&H of 12.5/36.3 with a platelet count of 146.  Differential mainly showed neutropenia with an ANC of 1.3And lymphopenia.  2 years ago in November 2020 patient had a normal white count of 5.1 with an ANC of 3.8.  In June 2022 her white count was 2.8 with an ANC of 1.9.  Patient reports feeling well overall.  Denies any unintentional weight loss or drenching night sweats.  Denies any recent illnesses.  Reports ongoing low back pain.  She also had symptoms of dyspepsia and underwent EGD recently  ECOG PS- 1  Pain scale- 3   Review of systems- Review of Systems  Constitutional:  Positive for malaise/fatigue. Negative for chills, fever and weight loss.  HENT:  Negative for congestion, ear discharge and nosebleeds.   Eyes:  Negative for blurred vision.  Respiratory:  Negative for cough, hemoptysis, sputum production, shortness of breath and wheezing.   Cardiovascular:  Negative for chest pain, palpitations, orthopnea and claudication.  Gastrointestinal:  Negative for abdominal pain, blood in stool, constipation, diarrhea, heartburn, melena, nausea and vomiting.  Genitourinary:  Negative for dysuria, flank pain, frequency, hematuria and urgency.  Musculoskeletal:  Negative for back pain, joint pain and myalgias.  Skin:  Negative for rash.  Neurological:  Negative for dizziness, tingling, focal weakness,  seizures, weakness and headaches.  Endo/Heme/Allergies:  Does not bruise/bleed easily.  Psychiatric/Behavioral:  Negative for depression and suicidal ideas. The patient does not have insomnia.    Allergies  Allergen Reactions   Rosuvastatin Nausea Only    Patient Active Problem List   Diagnosis Date Noted   Hypertension 02/20/2015     Past Medical History:  Diagnosis Date   Arthritis    GERD (gastroesophageal reflux disease)    Hyperlipidemia    Hypertension    Osteopenia of hip 2020   Bone spares   Overactive bladder    Vertigo    Vertigo      Past Surgical History:  Procedure Laterality Date   ESOPHAGOGASTRODUODENOSCOPY (EGD) WITH PROPOFOL N/A 06/16/2021   Procedure: ESOPHAGOGASTRODUODENOSCOPY (EGD) WITH PROPOFOL;  Surgeon: Lesly Rubenstein, MD;  Location: [facility] ENDOSCOPY;  Service: Endoscopy;  Laterality: N/A;    Social History   Socioeconomic History   Marital status: Single    Spouse name: Not on file   Number of children: Not on file   Years of education: Not on file   Highest education level: Not on file  Occupational History   Not on file  Tobacco Use   Smoking status: Never   Smokeless tobacco: Never  Vaping Use   Vaping Use: Never used  Substance and Sexual Activity   Alcohol use: No   Drug use: No   Sexual activity: Not on file  Other Topics Concern   Not on file  Social History Narrative   Not on file   Social Determinants of Health   Financial Resource Strain: Not on file  Food Insecurity: Not on file  Transportation Needs: Not  on file  Physical Activity: Not on file  Stress: Not on file  Social Connections: Not on file  Intimate Partner Violence: Not on file     Family History  Problem Relation Age of Onset   Brain cancer Mother    Hypertension Father    Stroke Father    Kidney cancer Neg Hx    Bladder Cancer Neg Hx      Current Outpatient Medications:    Cholecalciferol (VITAMIN D) 2000 units tablet, Take by mouth.,  Disp: , Rfl:    fluconazole (DIFLUCAN) 150 MG tablet, Take one tab for symptoms of yeast infection. Repeat x 1 in 72 hours., Disp: 2 tablet, Rfl: 0   losartan (COZAAR) 25 MG tablet, TAKE 1 TABLET (25 MG TOTAL) BY MOUTH ONCE DAILY., Disp: , Rfl:    meclizine (ANTIVERT) 12.5 MG tablet, Take 12.5 mg by mouth 3 (three) times daily as needed for dizziness., Disp: , Rfl:    NONFORMULARY OR COMPOUNDED ITEM, Apply 1-2 g topically 4 (four) times daily., Disp: 120 each, Rfl: 2   aspirin EC 81 MG tablet, Take 81 mg by mouth daily. Swallow whole. (Patient not taking: No sig reported), Disp: , Rfl:    cyclobenzaprine (FLEXERIL) 10 MG tablet, Take 1 tablet (10 mg total) by mouth 3 (three) times daily as needed. (Patient not taking: No sig reported), Disp: 30 tablet, Rfl: 0   HYDROcodone-acetaminophen (NORCO/VICODIN) 5-325 MG tablet, Take 1 tablet by mouth every 6 (six) hours as needed for moderate pain. (Patient not taking: No sig reported), Disp: 8 tablet, Rfl: 0   meloxicam (MOBIC) 15 MG tablet, Take 1 tablet (15 mg total) by mouth daily. (Patient not taking: No sig reported), Disp: 30 tablet, Rfl: 2   naproxen sodium (ALEVE) 220 MG tablet, Take 220 mg by mouth 2 (two) times daily as needed. (Patient not taking: Reported on 06/26/2021), Disp: , Rfl:    omeprazole (PRILOSEC) 20 MG capsule, Take by mouth., Disp: , Rfl:    predniSONE (DELTASONE) 10 MG tablet, Take 10 mg by mouth daily with breakfast. (Patient not taking: Reported on 06/26/2021), Disp: , Rfl:    traMADol (ULTRAM) 50 MG tablet, tramadol 50 mg tablet  TAKE 1 TABLET BY MOUTH EVERY 4 HOURS AS NEEDED, Disp: , Rfl:   Current Facility-Administered Medications:    betamethasone acetate-betamethasone sodium phosphate (CELESTONE) injection 3 mg, 3 mg, Intramuscular, Once, Edrick Kins, DPM   Physical exam:  Vitals:   06/26/21 1334  BP: (!) 133/92  Pulse: 83  Resp: 17  Temp: 98.9 F (37.2 C)  TempSrc: Tympanic  SpO2: 100%  Weight: 140 lb (63.5 kg)    Physical Exam Constitutional:      General: She is not in acute distress. Cardiovascular:     Rate and Rhythm: Normal rate and regular rhythm.     Heart sounds: Normal heart sounds.  Pulmonary:     Effort: Pulmonary effort is normal.     Breath sounds: Normal breath sounds.  Abdominal:     General: Bowel sounds are normal.     Palpations: Abdomen is soft.     Comments: No palpable hepatosplenomegaly  Lymphadenopathy:     Comments: No palpable cervical, supraclavicular, axillary or inguinal adenopathy    Skin:    General: Skin is warm and dry.  Neurological:     Mental Status: She is alert and oriented to person, place, and time.       CMP Latest Ref Rng & Units 09/05/2014  Glucose 65 - 99 mg/dL 103(H)  BUN 7 - 18 mg/dL 15  Creatinine 0.60 - 1.30 mg/dL 0.83  Sodium 136 - 145 mmol/L 140  Potassium 3.5 - 5.1 mmol/L 4.0  Chloride 98 - 107 mmol/L 106  CO2 21 - 32 mmol/L 25  Calcium 8.5 - 10.1 mg/dL 8.6  Total Protein 6.4 - 8.2 g/dL 7.3  Total Bilirubin 0.2 - 1.0 mg/dL 0.4  Alkaline Phos Unit/L 92  AST 15 - 37 Unit/L 23  ALT U/L 23   CBC Latest Ref Rng & Units 06/26/2021  WBC 4.0 - 10.5 K/uL 4.3  Hemoglobin 12.0 - 15.0 g/dL 14.4  Hematocrit 36.0 - 46.0 % 43.0  Platelets 150 - 400 K/uL 159    No images are attached to the encounter.  DG Lumbar Spine 2-3 Views  Result Date: 06/06/2021 CLINICAL DATA:  Low back pain. EXAM: LUMBAR SPINE - 2-3 VIEW COMPARISON:  Lumbar spine x-ray report dated November 26, 2018. FINDINGS: Five lumbar type vertebral bodies. No acute fracture or subluxation. Vertebral body heights are preserved. Alignment is normal. Mild L4-L5 and moderate to severe L5-S1 disc height loss. Osteopenia.  The sacroiliac joints are unremarkable. IMPRESSION: 1.  No acute osseous abnormality. 2. Lower lumbar spondylosis as described above. Electronically Signed   By: Titus Dubin M.D.   On: 06/06/2021 11:00    Assessment and plan- Patient is a 70 y.o. female  referred for leukopenia/neutropenia  Patient had a normal white cell count and ANC up until November 2020 and following that she was found to have  leukopenia/neutropenia on 2 occasions.  Most recent CBC showed white count of 2.2 with an ANC of 1.3.  Patient denies any recent illnesses or over-the-counter medications.  No repeated infections.  We will plan to check CBC with differential, smear review, LDH B12 folate HIV hep C ANA comprehensive panel and peripheral flow cytometry at this time.  Video visit in 2 weeks   Thank you for this kind referral and the opportunity to participate in the care of this patient   Visit Diagnosis 1. Other neutropenia ([place])     Dr. Randa Evens, MD, MPH [facility] at [facility] [phone] 06/26/2021

## 2021-06-27 LAB — ANA COMPREHENSIVE PANEL

## 2021-06-29 LAB — COMP PANEL: LEUKEMIA/LYMPHOMA

## 2021-07-03 ENCOUNTER — Encounter: Payer: Self-pay | Admitting: Oncology

## 2021-07-12 ENCOUNTER — Inpatient Hospital Stay: Payer: Medicare HMO

## 2021-07-12 ENCOUNTER — Inpatient Hospital Stay: Payer: Medicare HMO | Attending: Oncology | Admitting: Oncology

## 2021-07-12 ENCOUNTER — Other Ambulatory Visit: Payer: Self-pay

## 2021-07-12 VITALS — BP 114/81 | HR 80 | Temp 98.4°F | Resp 16 | Wt 145.5 lb

## 2021-07-12 DIAGNOSIS — D708 Other neutropenia: Secondary | ICD-10-CM

## 2021-07-12 DIAGNOSIS — E538 Deficiency of other specified B group vitamins: Secondary | ICD-10-CM | POA: Diagnosis not present

## 2021-07-12 DIAGNOSIS — R5383 Other fatigue: Secondary | ICD-10-CM

## 2021-07-12 MED ORDER — CYANOCOBALAMIN 1000 MCG/ML IJ SOLN
1000.0000 ug | Freq: Once | INTRAMUSCULAR | Status: AC
Start: 2021-07-12 — End: 2021-07-12
  Administered 2021-07-12: 1000 ug via INTRAMUSCULAR
  Filled 2021-07-12: qty 1

## 2021-07-12 MED ORDER — CYANOCOBALAMIN 1000 MCG/ML IJ SOLN: 1000.0000 ug | Freq: Once | INTRAMUSCULAR | Status: DC

## 2021-07-15 ENCOUNTER — Encounter: Payer: Self-pay | Admitting: Oncology

## 2021-07-15 NOTE — Progress Notes (Signed)
Hematology/Oncology Consult note [facility]  Telephone:([phone] [phone]  Patient Care Team: Sofie Hartigan, MD as PCP - General (Family Medicine)   Name of the patient: Kaitlin Wilson  [id number]  January 15, 1951   Date of visit: 07/15/21  Diagnosis-leukopenia: Transient and resolved  Chief complaint/ Reason for visit-discuss results of blood work  Heme/Onc history: patient is a 70 year old female with a past medical history significant for hypertension hyperlipidemia GERD and arthritis referred for leukopenia.  Most recent CBC from 05/19/2021 showed a white count of 2.2, H&H of 12.5/36.3 with a platelet count of 146.  Differential mainly showed neutropenia with an ANC of 1.3And lymphopenia.  2 years ago in November 2020 patient had a normal white count of 5.1 with an ANC of 3.8.  In June 2022 her white count was 2.8 with an ANC of 1.9.  Patient reports feeling well overall.  Denies any unintentional weight loss or drenching night sweats.  Denies any recent illnesses.  Reports ongoing low back pain.  She also had symptoms of dyspepsia and underwent EGD recently  Results of blood work from 06/26/2021 were as follows: CBC showed white cell count of 4.3, H&H of 14.4/43 and a platelet count of 159 with a normal differential.  HIV and hepatitis C testing negative.  B12 levels low at 139.  Folate levels normal at 20.7.  Flow cytometry did not show any immunophenotypic abnormality.  ANA comprehensive panel smear review and LDH normal.  Interval history-patient reports mild fatigue but denies other complaints at this time  ECOG PS- 1 Pain scale- 0   Review of systems- Review of Systems  Constitutional:  Positive for malaise/fatigue. Negative for chills, fever and weight loss.  HENT:  Negative for congestion, ear discharge and nosebleeds.   Eyes:  Negative for blurred vision.  Respiratory:  Negative for cough, hemoptysis, sputum production, shortness  of breath and wheezing.   Cardiovascular:  Negative for chest pain, palpitations, orthopnea and claudication.  Gastrointestinal:  Negative for abdominal pain, blood in stool, constipation, diarrhea, heartburn, melena, nausea and vomiting.  Genitourinary:  Negative for dysuria, flank pain, frequency, hematuria and urgency.  Musculoskeletal:  Negative for back pain, joint pain and myalgias.  Skin:  Negative for rash.  Neurological:  Negative for dizziness, tingling, focal weakness, seizures, weakness and headaches.  Endo/Heme/Allergies:  Does not bruise/bleed easily.  Psychiatric/Behavioral:  Negative for depression and suicidal ideas. The patient does not have insomnia.      Allergies  Allergen Reactions   Rosuvastatin Nausea Only     Past Medical History:  Diagnosis Date   Arthritis    GERD (gastroesophageal reflux disease)    Hyperlipidemia    Hypertension    Osteopenia of hip 2020   Bone spares   Overactive bladder    Vertigo    Vertigo      Past Surgical History:  Procedure Laterality Date   ESOPHAGOGASTRODUODENOSCOPY (EGD) WITH PROPOFOL N/A 06/16/2021   Procedure: ESOPHAGOGASTRODUODENOSCOPY (EGD) WITH PROPOFOL;  Surgeon: Lesly Rubenstein, MD;  Location: [facility] ENDOSCOPY;  Service: Endoscopy;  Laterality: N/A;    Social History   Socioeconomic History   Marital status: Single    Spouse name: Not on file   Number of children: Not on file   Years of education: Not on file   Highest education level: Not on file  Occupational History   Not on file  Tobacco Use   Smoking status: Never   Smokeless tobacco: Never  Vaping  Use   Vaping Use: Never used  Substance and Sexual Activity   Alcohol use: No   Drug use: No   Sexual activity: Not on file  Other Topics Concern   Not on file  Social History Narrative   Not on file   Social Determinants of Health   Financial Resource Strain: Not on file  Food Insecurity: Not on file  Transportation Needs: Not on file   Physical Activity: Not on file  Stress: Not on file  Social Connections: Not on file  Intimate Partner Violence: Not on file    Family History  Problem Relation Age of Onset   Brain cancer Mother    Hypertension Father    Stroke Father    Kidney cancer Neg Hx    Bladder Cancer Neg Hx      Current Outpatient Medications:    Cholecalciferol (VITAMIN D) 2000 units tablet, Take by mouth., Disp: , Rfl:    losartan (COZAAR) 25 MG tablet, TAKE 1 TABLET (25 MG TOTAL) BY MOUTH ONCE DAILY., Disp: , Rfl:    NONFORMULARY OR COMPOUNDED ITEM, Apply 1-2 g topically 4 (four) times daily., Disp: 120 each, Rfl: 2   omeprazole (PRILOSEC) 20 MG capsule, Take by mouth., Disp: , Rfl:    aspirin EC 81 MG tablet, Take 81 mg by mouth daily. Swallow whole. (Patient not taking: No sig reported), Disp: , Rfl:    cyclobenzaprine (FLEXERIL) 10 MG tablet, Take 1 tablet (10 mg total) by mouth 3 (three) times daily as needed. (Patient not taking: No sig reported), Disp: 30 tablet, Rfl: 0   hydrochlorothiazide (MICROZIDE) 12.5 MG capsule, Take 12.5 mg by mouth daily. (Patient not taking: Reported on 07/12/2021), Disp: , Rfl:    meclizine (ANTIVERT) 12.5 MG tablet, Take 12.5 mg by mouth 3 (three) times daily as needed for dizziness. (Patient not taking: Reported on 07/12/2021), Disp: , Rfl:    meloxicam (MOBIC) 15 MG tablet, Take 1 tablet (15 mg total) by mouth daily. (Patient not taking: No sig reported), Disp: 30 tablet, Rfl: 2   naproxen sodium (ALEVE) 220 MG tablet, Take 220 mg by mouth 2 (two) times daily as needed. (Patient not taking: No sig reported), Disp: , Rfl:    oxybutynin (DITROPAN-XL) 5 MG 24 hr tablet, Take 5 mg by mouth daily. (Patient not taking: Reported on 07/12/2021), Disp: , Rfl:    predniSONE (DELTASONE) 10 MG tablet, Take 10 mg by mouth daily with breakfast. (Patient not taking: No sig reported), Disp: , Rfl:    tiZANidine (ZANAFLEX) 2 MG tablet, Take 2 mg by mouth 3 (three) times daily as  needed. (Patient not taking: Reported on 07/12/2021), Disp: , Rfl:    traMADol (ULTRAM) 50 MG tablet, tramadol 50 mg tablet  TAKE 1 TABLET BY MOUTH EVERY 4 HOURS AS NEEDED (Patient not taking: Reported on 07/12/2021), Disp: , Rfl:   Current Facility-Administered Medications:    betamethasone acetate-betamethasone sodium phosphate (CELESTONE) injection 3 mg, 3 mg, Intramuscular, Once, Evans, Brent M, DPM   cyanocobalamin ((VITAMIN B-12)) injection 1,000 mcg, 1,000 mcg, Intramuscular, Once, Sindy Guadeloupe, MD  Physical exam:  Vitals:   07/12/21 1114  BP: 114/81  Pulse: 80  Resp: 16  Temp: 98.4 F (36.9 C)  SpO2: 100%  Weight: 145 lb 8 oz (66 kg)   Physical Exam Constitutional:      General: She is not in acute distress. Cardiovascular:     Rate and Rhythm: Normal rate and regular rhythm.  Heart sounds: Normal heart sounds.  Pulmonary:     Effort: Pulmonary effort is normal.     Breath sounds: Normal breath sounds.  Abdominal:     General: Bowel sounds are normal.     Palpations: Abdomen is soft.  Skin:    General: Skin is warm and dry.  Neurological:     Mental Status: She is alert and oriented to person, place, and time.     CMP Latest Ref Rng & Units 09/05/2014  Glucose 65 - 99 mg/dL 103(H)  BUN 7 - 18 mg/dL 15  Creatinine 0.60 - 1.30 mg/dL 0.83  Sodium 136 - 145 mmol/L 140  Potassium 3.5 - 5.1 mmol/L 4.0  Chloride 98 - 107 mmol/L 106  CO2 21 - 32 mmol/L 25  Calcium 8.5 - 10.1 mg/dL 8.6  Total Protein 6.4 - 8.2 g/dL 7.3  Total Bilirubin 0.2 - 1.0 mg/dL 0.4  Alkaline Phos Unit/L 92  AST 15 - 37 Unit/L 23  ALT U/L 23   CBC Latest Ref Rng & Units 06/26/2021  WBC 4.0 - 10.5 K/uL 4.3  Hemoglobin 12.0 - 15.0 g/dL 14.4  Hematocrit 36.0 - 46.0 % 43.0  Platelets 150 - 400 K/uL 159     Assessment and plan- Patient is a 70 y.o. female referred for leukopenia here to discuss results of blood work  On repeat testing her CBC showed a normal white cell count with a  normal differential.  Flow cytometry, ANA comprehensive panel, folate, LDH, smear review unremarkable.  Her B12 levels were low suggestive of B12 deficiency.  We will give her 1 dose of B12 injection today.  Patient is willing to try B12 injections at home and we will send her a prescription for the same.  She will take the injection every month.Patient prefers to take oral B12 at this time and wishes to avoid repeat CBC and B12 levels in 2 1 4  months and I will see her back in 4 months   Visit Diagnosis 1. Other neutropenia ([place])   2. Other fatigue   3. B12 deficiency      Dr. Randa Evens, MD, MPH [facility] at [facility] [phone] 07/15/2021 7:34 AM

## 2021-07-21 ENCOUNTER — Telehealth: Payer: Self-pay | Admitting: Oncology

## 2021-07-26 DIAGNOSIS — M1611 Unilateral primary osteoarthritis, right hip: Secondary | ICD-10-CM | POA: Diagnosis not present

## 2021-07-26 NOTE — Telephone Encounter (Signed)
Forward to twam

## 2021-08-11 ENCOUNTER — Telehealth: Payer: Self-pay | Admitting: *Deleted

## 2021-08-11 NOTE — Telephone Encounter (Signed)
Patient was hesitant to stop the B12 afraid her WBC will drop. We discussed stopping fr a week and seeing how she feels and if not better, she can restart it and if better the optin is to start monthly injections. She has agreed to this and will let us know how she feels next week.

## 2021-08-11 NOTE — Telephone Encounter (Signed)
Patient called stating that she has been taking 1000 mcg of B 12 daily since September and she feels it is making her dizzy and tired. Please advise

## 2021-08-11 NOTE — Telephone Encounter (Signed)
She can stop the tablet and if she is better after a week or so ask her if she wants to come for monthly injections

## 2021-08-14 DIAGNOSIS — B9689 Other specified bacterial agents as the cause of diseases classified elsewhere: Secondary | ICD-10-CM | POA: Diagnosis not present

## 2021-08-14 DIAGNOSIS — Z4689 Encounter for fitting and adjustment of other specified devices: Secondary | ICD-10-CM | POA: Diagnosis not present

## 2021-08-14 DIAGNOSIS — N898 Other specified noninflammatory disorders of vagina: Secondary | ICD-10-CM | POA: Diagnosis not present

## 2021-08-14 DIAGNOSIS — N760 Acute vaginitis: Secondary | ICD-10-CM | POA: Diagnosis not present

## 2021-09-11 ENCOUNTER — Other Ambulatory Visit: Payer: Self-pay

## 2021-09-11 ENCOUNTER — Inpatient Hospital Stay: Payer: Medicare HMO | Attending: Oncology

## 2021-09-11 DIAGNOSIS — D709 Neutropenia, unspecified: Secondary | ICD-10-CM | POA: Insufficient documentation

## 2021-09-11 DIAGNOSIS — E538 Deficiency of other specified B group vitamins: Secondary | ICD-10-CM | POA: Diagnosis not present

## 2021-09-11 DIAGNOSIS — D708 Other neutropenia: Secondary | ICD-10-CM

## 2021-09-11 LAB — CBC WITH DIFFERENTIAL/PLATELET
Abs Immature Granulocytes: 0.01 10*3/uL (ref 0.00–0.07)
Basophils Absolute: 0 10*3/uL (ref 0.0–0.1)
Basophils Relative: 0 %
Eosinophils Absolute: 0.1 10*3/uL (ref 0.0–0.5)
Eosinophils Relative: 2 %
HCT: 39.2 % (ref 36.0–46.0)
Hemoglobin: 13.5 g/dL (ref 12.0–15.0)
Immature Granulocytes: 0 %
Lymphocytes Relative: 32 %
Lymphs Abs: 1 10*3/uL (ref 0.7–4.0)
MCH: 30.4 pg (ref 26.0–34.0)
MCHC: 34.4 g/dL (ref 30.0–36.0)
MCV: 88.3 fL (ref 80.0–100.0)
Monocytes Absolute: 0.1 10*3/uL (ref 0.1–1.0)
Monocytes Relative: 4 %
Neutro Abs: 1.9 10*3/uL (ref 1.7–7.7)
Neutrophils Relative %: 62 %
Platelets: 134 10*3/uL — ABNORMAL LOW (ref 150–400)
RBC: 4.44 MIL/uL (ref 3.87–5.11)
RDW: 12.8 % (ref 11.5–15.5)
WBC: 3.1 10*3/uL — ABNORMAL LOW (ref 4.0–10.5)
nRBC: 0 % (ref 0.0–0.2)

## 2021-09-11 LAB — VITAMIN B12: Vitamin B-12: 421 pg/mL (ref 180–914)

## 2021-09-13 DIAGNOSIS — K219 Gastro-esophageal reflux disease without esophagitis: Secondary | ICD-10-CM | POA: Diagnosis not present

## 2021-09-13 DIAGNOSIS — R194 Change in bowel habit: Secondary | ICD-10-CM | POA: Diagnosis not present

## 2021-09-26 DIAGNOSIS — I480 Paroxysmal atrial fibrillation: Secondary | ICD-10-CM | POA: Diagnosis not present

## 2021-09-26 DIAGNOSIS — E7800 Pure hypercholesterolemia, unspecified: Secondary | ICD-10-CM | POA: Diagnosis not present

## 2021-09-26 DIAGNOSIS — E538 Deficiency of other specified B group vitamins: Secondary | ICD-10-CM | POA: Diagnosis not present

## 2021-09-26 DIAGNOSIS — K219 Gastro-esophageal reflux disease without esophagitis: Secondary | ICD-10-CM | POA: Diagnosis not present

## 2021-09-26 DIAGNOSIS — M159 Polyosteoarthritis, unspecified: Secondary | ICD-10-CM | POA: Diagnosis not present

## 2021-09-26 DIAGNOSIS — D709 Neutropenia, unspecified: Secondary | ICD-10-CM | POA: Diagnosis not present

## 2021-09-26 DIAGNOSIS — R42 Dizziness and giddiness: Secondary | ICD-10-CM | POA: Diagnosis not present

## 2021-09-26 DIAGNOSIS — N3281 Overactive bladder: Secondary | ICD-10-CM | POA: Diagnosis not present

## 2021-09-26 DIAGNOSIS — I10 Essential (primary) hypertension: Secondary | ICD-10-CM | POA: Diagnosis not present

## 2021-10-04 ENCOUNTER — Other Ambulatory Visit: Payer: Self-pay | Admitting: Orthopedic Surgery

## 2021-10-04 DIAGNOSIS — M79671 Pain in right foot: Secondary | ICD-10-CM | POA: Diagnosis not present

## 2021-10-04 DIAGNOSIS — D2371 Other benign neoplasm of skin of right lower limb, including hip: Secondary | ICD-10-CM | POA: Diagnosis not present

## 2021-10-10 ENCOUNTER — Encounter
Admission: RE | Admit: 2021-10-10 | Discharge: 2021-10-10 | Disposition: A | Discharge status: Home / Self Care | Payer: Medicare HMO | Source: Ambulatory Visit | Attending: Orthopedic Surgery | Admitting: Orthopedic Surgery

## 2021-10-10 ENCOUNTER — Other Ambulatory Visit: Payer: Self-pay

## 2021-10-10 VITALS — BP 103/83 | HR 89 | Temp 98.1°F | Resp 20 | Ht <= 58 in | Wt 149.0 lb

## 2021-10-10 DIAGNOSIS — Z01812 Encounter for preprocedural laboratory examination: Secondary | ICD-10-CM

## 2021-10-10 DIAGNOSIS — Z01818 Encounter for other preprocedural examination: Secondary | ICD-10-CM | POA: Insufficient documentation

## 2021-10-10 HISTORY — DX: Cardiac arrhythmia, unspecified: I49.9

## 2021-10-10 LAB — URINALYSIS, ROUTINE W REFLEX MICROSCOPIC
Bilirubin Urine: NEGATIVE
Glucose, UA: NEGATIVE mg/dL
Ketones, ur: NEGATIVE mg/dL
Nitrite: NEGATIVE
Protein, ur: NEGATIVE mg/dL
Specific Gravity, Urine: 1.018 (ref 1.005–1.030)
pH: 7 (ref 5.0–8.0)

## 2021-10-10 LAB — CBC WITH DIFFERENTIAL/PLATELET
Abs Immature Granulocytes: 0.01 10*3/uL (ref 0.00–0.07)
Basophils Absolute: 0 10*3/uL (ref 0.0–0.1)
Basophils Relative: 1 %
Eosinophils Absolute: 0.1 10*3/uL (ref 0.0–0.5)
Eosinophils Relative: 2 %
HCT: 39.2 % (ref 36.0–46.0)
Hemoglobin: 13.4 g/dL (ref 12.0–15.0)
Immature Granulocytes: 0 %
Lymphocytes Relative: 20 %
Lymphs Abs: 0.6 10*3/uL — ABNORMAL LOW (ref 0.7–4.0)
MCH: 30 pg (ref 26.0–34.0)
MCHC: 34.2 g/dL (ref 30.0–36.0)
MCV: 87.7 fL (ref 80.0–100.0)
Monocytes Absolute: 0.2 10*3/uL (ref 0.1–1.0)
Monocytes Relative: 5 %
Neutro Abs: 2.2 10*3/uL (ref 1.7–7.7)
Neutrophils Relative %: 72 %
Platelets: 139 10*3/uL — ABNORMAL LOW (ref 150–400)
RBC: 4.47 MIL/uL (ref 3.87–5.11)
RDW: 12.3 % (ref 11.5–15.5)
WBC: 3.1 10*3/uL — ABNORMAL LOW (ref 4.0–10.5)
nRBC: 0 % (ref 0.0–0.2)

## 2021-10-10 LAB — COMPREHENSIVE METABOLIC PANEL
ALT: 11 U/L (ref 0–44)
AST: 17 U/L (ref 15–41)
Albumin: 4.2 g/dL (ref 3.5–5.0)
Alkaline Phosphatase: 84 U/L (ref 38–126)
Anion gap: 6 (ref 5–15)
BUN: 17 mg/dL (ref 8–23)
CO2: 28 mmol/L (ref 22–32)
Calcium: 9.1 mg/dL (ref 8.9–10.3)
Chloride: 106 mmol/L (ref 98–111)
Creatinine, Ser: 0.78 mg/dL (ref 0.44–1.00)
GFR, Estimated: >60 mL/min (ref >60)
Glucose, Bld: 117 mg/dL — ABNORMAL HIGH (ref 70–99)
Potassium: 3.7 mmol/L (ref 3.5–5.1)
Sodium: 140 mmol/L (ref 135–145)
Total Bilirubin: 0.6 mg/dL (ref 0.3–1.2)
Total Protein: 7.1 g/dL (ref 6.5–8.1)

## 2021-10-10 LAB — SURGICAL PCR SCREEN
MRSA, PCR: NEGATIVE
Staphylococcus aureus: NEGATIVE

## 2021-10-10 LAB — TYPE AND SCREEN
ABO/RH(D): A POS
Antibody Screen: NEGATIVE

## 2021-10-10 NOTE — Patient Instructions (Addendum)
Your procedure is scheduled on: Tuesday October 24, 2021. Report to Day Surgery inside [facility] 2nd floor. To find out your arrival time please call [phone] between 1PM - 3PM on Monday October 23, 2021.  Remember: Instructions that are not followed completely may result in serious medical risk,  up to and including death, or upon the discretion of your surgeon and anesthesiologist your  surgery may need to be rescheduled.     _X__ 1. Do not eat food after midnight the night before your procedure.                 No chewing gum or hard candies. You may drink clear liquids up to 2 hours                 before you are scheduled to arrive for your surgery- DO not drink clear                 liquids within 2 hours of the start of your surgery.                 Clear Liquids include:  water, apple juice without pulp, clear Gatorade, G2 or                  Gatorade Zero (avoid Red/Purple/Blue), Black Coffee or Tea (Do not add                 anything to coffee or tea).  __X__2.   Complete the "Ensure Clear Pre-surgery Clear Carbohydrate Drink" provided to you, 2 hours before arrival. **If you are diabetic you will be provided with an alternative drink, Gatorade Zero or G2.  __X__3.  On the morning of surgery brush your teeth with toothpaste and water, you                may rinse your mouth with mouthwash if you wish.  Do not swallow any toothpaste of mouthwash.     _X__ 4.  No Alcohol for 24 hours before or after surgery.   _X__ 5.  Do Not Smoke or use e-cigarettes For 24 Hours Prior to Your Surgery.                 Do not use any chewable tobacco products for at least 6 hours prior to                 Surgery.  _X__  6.  Do not use any recreational drugs (marijuana, cocaine, heroin, ecstasy, MDMA or other)                For at least one week prior to your surgery.  Combination of these drugs with anesthesia                May have life threatening  results.  ____  7.  Bring all medications with you on the day of surgery if instructed.   __X__  8.  Notify your doctor if there is any change in your medical condition      (cold, fever, infections).     Do not wear jewelry, make-up, hairpins, clips or nail polish. Do not wear lotions, powders, or perfumes. You may wear deodorant. Do not shave 48 hours prior to surgery. Men may shave face and neck. Do not bring valuables to the hospital.    [facility] is not responsible for any belongings or valuables.  Contacts, dentures or bridgework may not be worn  into surgery. Leave your suitcase in the car. After surgery it may be brought to your room. For patients admitted to the hospital, discharge time is determined by your treatment team.   Patients discharged the day of surgery will not be allowed to drive home.   Make arrangements for someone to be with you for the first 24 hours of your Same Day Discharge.    Please read over the following fact sheets that you were given:   Total Joint Packet    __X__ Take these medicines the morning of surgery with A SIP OF WATER:    1. lansoprazole (PREVACID) 30 MG   2.   3.   4.  5.  6.  ____ Fleet Enema (as directed)   __X__ Use CHG Soap (or wipes) as directed  ____ Use Benzoyl Peroxide Gel as instructed  ____ Use inhalers on the day of surgery  ____ Stop metformin 2 days prior to surgery    ____ Take 1/2 of usual insulin dose the night before surgery. No insulin the morning          of surgery.   ____ Call your PCP, cardiologist, or Pulmonologist if taking Coumadin/Plavix/aspirin and ask when to stop before your surgery.   __X__ One Week prior to surgery- Stop Anti-inflammatories such as Ibuprofen, Aleve, Advil, Motrin, meloxicam (MOBIC), diclofenac, etodolac, ketorolac, Toradol, Daypro, piroxicam, Goody's or BC powders. OK TO USE TYLENOL IF NEEDED   __X__ Stop supplements until after surgery.    ____ Bring C-Pap to the  hospital.    If you have any questions regarding your pre-procedure instructions,  Please call Pre-admit Testing at [phone]

## 2021-10-18 DIAGNOSIS — R7309 Other abnormal glucose: Secondary | ICD-10-CM | POA: Diagnosis not present

## 2021-10-20 ENCOUNTER — Other Ambulatory Visit: Payer: Self-pay

## 2021-10-20 ENCOUNTER — Other Ambulatory Visit
Admission: RE | Admit: 2021-10-20 | Discharge: 2021-10-20 | Disposition: A | Discharge status: Home / Self Care | Payer: Medicare HMO | Source: Ambulatory Visit | Attending: Orthopedic Surgery | Admitting: Orthopedic Surgery

## 2021-10-20 DIAGNOSIS — Z01812 Encounter for preprocedural laboratory examination: Secondary | ICD-10-CM | POA: Diagnosis not present

## 2021-10-20 DIAGNOSIS — Z20822 Contact with and (suspected) exposure to covid-19: Secondary | ICD-10-CM | POA: Insufficient documentation

## 2021-10-20 LAB — SARS CORONAVIRUS 2 (TAT 6-24 HRS): SARS Coronavirus 2: NEGATIVE

## 2021-10-24 ENCOUNTER — Ambulatory Visit: Payer: Medicare HMO | Admitting: Anesthesiology

## 2021-10-24 ENCOUNTER — Observation Stay
Admission: RE | Admit: 2021-10-24 | Discharge: 2021-10-27 | Disposition: A | Discharge status: Skilled Nursing Facility | Payer: Medicare HMO | Attending: Orthopedic Surgery | Admitting: Orthopedic Surgery

## 2021-10-24 ENCOUNTER — Observation Stay: Payer: Medicare HMO

## 2021-10-24 ENCOUNTER — Encounter
Admission: RE | Disposition: A | Discharge status: Skilled Nursing Facility | Payer: Self-pay | Source: Home / Self Care | Attending: Orthopedic Surgery

## 2021-10-24 ENCOUNTER — Encounter: Payer: Self-pay | Admitting: Orthopedic Surgery

## 2021-10-24 ENCOUNTER — Other Ambulatory Visit: Payer: Self-pay

## 2021-10-24 ENCOUNTER — Ambulatory Visit: Payer: Medicare HMO

## 2021-10-24 DIAGNOSIS — Z96641 Presence of right artificial hip joint: Secondary | ICD-10-CM

## 2021-10-24 DIAGNOSIS — I10 Essential (primary) hypertension: Secondary | ICD-10-CM | POA: Insufficient documentation

## 2021-10-24 DIAGNOSIS — Z79899 Other long term (current) drug therapy: Secondary | ICD-10-CM | POA: Insufficient documentation

## 2021-10-24 DIAGNOSIS — Z20822 Contact with and (suspected) exposure to covid-19: Secondary | ICD-10-CM | POA: Diagnosis not present

## 2021-10-24 DIAGNOSIS — G8918 Other acute postprocedural pain: Secondary | ICD-10-CM

## 2021-10-24 DIAGNOSIS — Z23 Encounter for immunization: Secondary | ICD-10-CM | POA: Insufficient documentation

## 2021-10-24 DIAGNOSIS — M1611 Unilateral primary osteoarthritis, right hip: Secondary | ICD-10-CM | POA: Diagnosis not present

## 2021-10-24 DIAGNOSIS — Z471 Aftercare following joint replacement surgery: Secondary | ICD-10-CM | POA: Diagnosis not present

## 2021-10-24 DIAGNOSIS — Z419 Encounter for procedure for purposes other than remedying health state, unspecified: Secondary | ICD-10-CM

## 2021-10-24 DIAGNOSIS — E785 Hyperlipidemia, unspecified: Secondary | ICD-10-CM | POA: Diagnosis not present

## 2021-10-24 HISTORY — PX: TOTAL HIP ARTHROPLASTY: SHX124

## 2021-10-24 LAB — CBC
HCT: 35.2 % — ABNORMAL LOW (ref 36.0–46.0)
Hemoglobin: 11.9 g/dL — ABNORMAL LOW (ref 12.0–15.0)
MCH: 29.8 pg (ref 26.0–34.0)
MCHC: 33.8 g/dL (ref 30.0–36.0)
MCV: 88 fL (ref 80.0–100.0)
Platelets: 132 10*3/uL — ABNORMAL LOW (ref 150–400)
RBC: 4 MIL/uL (ref 3.87–5.11)
RDW: 12.4 % (ref 11.5–15.5)
WBC: 6 10*3/uL (ref 4.0–10.5)
nRBC: 0 % (ref 0.0–0.2)

## 2021-10-24 LAB — ABO/RH: ABO/RH(D): A POS

## 2021-10-24 LAB — CREATININE, SERUM
Creatinine, Ser: 0.76 mg/dL (ref 0.44–1.00)
GFR, Estimated: >60 mL/min (ref >60)

## 2021-10-24 SURGERY — ARTHROPLASTY, HIP, TOTAL, ANTERIOR APPROACH
Anesthesia: Spinal | Site: Hip | Laterality: Right

## 2021-10-24 MED ORDER — PROPOFOL 1000 MG/100ML IV EMUL
INTRAVENOUS | Status: AC
  Filled 2021-10-24: qty 100

## 2021-10-24 MED ORDER — ZOLPIDEM TARTRATE 5 MG PO TABS: 5.0000 mg | ORAL_TABLET | Freq: Every evening | ORAL | Status: DC | PRN

## 2021-10-24 MED ORDER — LOSARTAN POTASSIUM 25 MG PO TABS
12.5000 mg | ORAL_TABLET | Freq: Every day | ORAL | Status: DC
  Administered 2021-10-24 – 2021-10-26 (×3): 12.5 mg via ORAL
  Filled 2021-10-24 (×3): qty 1

## 2021-10-24 MED ORDER — GLYCOPYRROLATE 0.2 MG/ML IJ SOLN
INTRAMUSCULAR | Status: AC
  Filled 2021-10-24: qty 1

## 2021-10-24 MED ORDER — CHLORHEXIDINE GLUCONATE 0.12 % MT SOLN: 15.0000 mL | Freq: Once | OROMUCOSAL | Status: AC

## 2021-10-24 MED ORDER — INFLUENZA VAC A&B SA ADJ QUAD 0.5 ML IM PRSY
0.5000 mL | PREFILLED_SYRINGE | INTRAMUSCULAR | Status: AC
  Administered 2021-10-26: 0.5 mL via INTRAMUSCULAR
  Filled 2021-10-24: qty 0.5

## 2021-10-24 MED ORDER — BUPIVACAINE-EPINEPHRINE (PF) 0.25% -1:200000 IJ SOLN
INTRAMUSCULAR | Status: AC
  Filled 2021-10-24: qty 30

## 2021-10-24 MED ORDER — SODIUM CHLORIDE (PF) 0.9 % IJ SOLN
INTRAMUSCULAR | Status: DC | PRN
  Administered 2021-10-24: 90 mL via INTRAMUSCULAR

## 2021-10-24 MED ORDER — TRAMADOL HCL 50 MG PO TABS
50.0000 mg | ORAL_TABLET | Freq: Four times a day (QID) | ORAL | Status: DC
  Administered 2021-10-25 – 2021-10-27 (×10): 50 mg via ORAL
  Filled 2021-10-24 (×12): qty 1

## 2021-10-24 MED ORDER — ONDANSETRON HCL 4 MG PO TABS: 4.0000 mg | ORAL_TABLET | Freq: Four times a day (QID) | ORAL | Status: DC | PRN

## 2021-10-24 MED ORDER — METHOCARBAMOL 1000 MG/10ML IJ SOLN
500.0000 mg | Freq: Four times a day (QID) | INTRAVENOUS | Status: DC | PRN
  Filled 2021-10-24: qty 5

## 2021-10-24 MED ORDER — FENTANYL CITRATE (PF) 100 MCG/2ML IJ SOLN
INTRAMUSCULAR | Status: AC
  Filled 2021-10-24: qty 2

## 2021-10-24 MED ORDER — METOCLOPRAMIDE HCL 5 MG/ML IJ SOLN: 5.0000 mg | Freq: Three times a day (TID) | INTRAMUSCULAR | Status: DC | PRN

## 2021-10-24 MED ORDER — ACETAMINOPHEN 325 MG PO TABS: 325.0000 mg | ORAL_TABLET | Freq: Four times a day (QID) | ORAL | Status: DC | PRN

## 2021-10-24 MED ORDER — ONDANSETRON HCL 4 MG/2ML IJ SOLN: 4.0000 mg | Freq: Four times a day (QID) | INTRAMUSCULAR | Status: DC | PRN

## 2021-10-24 MED ORDER — ASPIRIN EC 81 MG PO TBEC
81.0000 mg | DELAYED_RELEASE_TABLET | Freq: Every day | ORAL | Status: DC
  Administered 2021-10-24 – 2021-10-27 (×4): 81 mg via ORAL
  Filled 2021-10-24 (×4): qty 1

## 2021-10-24 MED ORDER — BISACODYL 10 MG RE SUPP
10.0000 mg | Freq: Every day | RECTAL | Status: DC | PRN
  Filled 2021-10-24: qty 1

## 2021-10-24 MED ORDER — FLEET ENEMA 7-19 GM/118ML RE ENEM: 1.0000 | ENEMA | Freq: Once | RECTAL | Status: DC | PRN

## 2021-10-24 MED ORDER — PROPOFOL 10 MG/ML IV BOLUS
INTRAVENOUS | Status: DC | PRN
  Administered 2021-10-24: 20 mg via INTRAVENOUS

## 2021-10-24 MED ORDER — ALUM HYDROXIDE-MAG CARBONATE 95-358 MG/15ML PO SUSP: 15.0000 mL | ORAL | Status: DC | PRN

## 2021-10-24 MED ORDER — MEPERIDINE HCL 25 MG/ML IJ SOLN
6.2500 mg | Freq: Once | INTRAMUSCULAR | Status: AC
  Administered 2021-10-24: 13:00:00 6.25 mg via INTRAVENOUS

## 2021-10-24 MED ORDER — CEFAZOLIN SODIUM-DEXTROSE 2-4 GM/100ML-% IV SOLN
INTRAVENOUS | Status: AC
  Filled 2021-10-24: qty 100

## 2021-10-24 MED ORDER — DOCUSATE SODIUM 100 MG PO CAPS
100.0000 mg | ORAL_CAPSULE | Freq: Two times a day (BID) | ORAL | Status: DC
  Administered 2021-10-24 – 2021-10-27 (×7): 100 mg via ORAL
  Filled 2021-10-24 (×7): qty 1

## 2021-10-24 MED ORDER — PANTOPRAZOLE SODIUM 20 MG PO TBEC
20.0000 mg | DELAYED_RELEASE_TABLET | Freq: Every day | ORAL | Status: DC
  Administered 2021-10-25 – 2021-10-27 (×3): 20 mg via ORAL
  Filled 2021-10-24 (×3): qty 1

## 2021-10-24 MED ORDER — DIPHENHYDRAMINE HCL 12.5 MG/5ML PO ELIX: 12.5000 mg | ORAL_SOLUTION | ORAL | Status: DC | PRN

## 2021-10-24 MED ORDER — NEOMYCIN-POLYMYXIN B GU 40-200000 IR SOLN
Status: AC
  Filled 2021-10-24: qty 4

## 2021-10-24 MED ORDER — HYDROCODONE-ACETAMINOPHEN 5-325 MG PO TABS
1.0000 | ORAL_TABLET | ORAL | Status: DC | PRN
  Administered 2021-10-24: 16:00:00 1 via ORAL
  Administered 2021-10-25: 09:00:00 2 via ORAL
  Filled 2021-10-24: qty 2
  Filled 2021-10-24: qty 1

## 2021-10-24 MED ORDER — PHENYLEPHRINE HCL-NACL 20-0.9 MG/250ML-% IV SOLN
INTRAVENOUS | Status: DC | PRN
  Administered 2021-10-24: 20 ug/min via INTRAVENOUS

## 2021-10-24 MED ORDER — GLYCOPYRROLATE 0.2 MG/ML IJ SOLN
INTRAMUSCULAR | Status: DC | PRN
  Administered 2021-10-24: .2 mg via INTRAVENOUS

## 2021-10-24 MED ORDER — PHENOL 1.4 % MT LIQD
1.0000 | OROMUCOSAL | Status: DC | PRN
  Filled 2021-10-24: qty 177

## 2021-10-24 MED ORDER — METOCLOPRAMIDE HCL 10 MG PO TABS: 5.0000 mg | ORAL_TABLET | Freq: Three times a day (TID) | ORAL | Status: DC | PRN

## 2021-10-24 MED ORDER — ENOXAPARIN SODIUM 40 MG/0.4ML IJ SOSY
40.0000 mg | PREFILLED_SYRINGE | INTRAMUSCULAR | Status: DC
  Administered 2021-10-25 – 2021-10-27 (×3): 40 mg via SUBCUTANEOUS
  Filled 2021-10-24 (×3): qty 0.4

## 2021-10-24 MED ORDER — LACTATED RINGERS IV SOLN: INTRAVENOUS | Status: DC

## 2021-10-24 MED ORDER — MIDAZOLAM HCL 2 MG/2ML IJ SOLN
INTRAMUSCULAR | Status: AC
  Filled 2021-10-24: qty 2

## 2021-10-24 MED ORDER — FENTANYL CITRATE (PF) 100 MCG/2ML IJ SOLN
25.0000 ug | INTRAMUSCULAR | Status: DC | PRN
  Administered 2021-10-24: 12:00:00 25 ug via INTRAVENOUS

## 2021-10-24 MED ORDER — POLYETHYLENE GLYCOL 3350 17 G PO PACK
17.0000 g | PACK | Freq: Every day | ORAL | Status: DC | PRN
  Administered 2021-10-26 – 2021-10-27 (×2): 17 g via ORAL
  Filled 2021-10-24 (×2): qty 1

## 2021-10-24 MED ORDER — PROPOFOL 500 MG/50ML IV EMUL
INTRAVENOUS | Status: DC | PRN
  Administered 2021-10-24: 50 ug/kg/min via INTRAVENOUS

## 2021-10-24 MED ORDER — HYDROCODONE-ACETAMINOPHEN 7.5-325 MG PO TABS
1.0000 | ORAL_TABLET | ORAL | Status: DC | PRN
  Administered 2021-10-24 – 2021-10-26 (×5): 1 via ORAL
  Administered 2021-10-27: 2 via ORAL
  Filled 2021-10-24 (×6): qty 1
  Filled 2021-10-24: qty 2

## 2021-10-24 MED ORDER — MEPERIDINE HCL 25 MG/ML IJ SOLN
INTRAMUSCULAR | Status: AC
  Filled 2021-10-24: qty 1

## 2021-10-24 MED ORDER — BUPIVACAINE LIPOSOME 1.3 % IJ SUSP
INTRAMUSCULAR | Status: AC
  Filled 2021-10-24: qty 20

## 2021-10-24 MED ORDER — PNEUMOCOCCAL VAC POLYVALENT 25 MCG/0.5ML IJ INJ
0.5000 mL | INJECTION | INTRAMUSCULAR | Status: AC
  Administered 2021-10-26: 0.5 mL via INTRAMUSCULAR
  Filled 2021-10-24: qty 0.5

## 2021-10-24 MED ORDER — PHENYLEPHRINE HCL-NACL 20-0.9 MG/250ML-% IV SOLN
INTRAVENOUS | Status: AC
  Filled 2021-10-24: qty 250

## 2021-10-24 MED ORDER — SODIUM CHLORIDE FLUSH 0.9 % IV SOLN
INTRAVENOUS | Status: AC
  Filled 2021-10-24: qty 40

## 2021-10-24 MED ORDER — METHOCARBAMOL 500 MG PO TABS
500.0000 mg | ORAL_TABLET | Freq: Four times a day (QID) | ORAL | Status: DC | PRN
  Administered 2021-10-24 – 2021-10-27 (×4): 500 mg via ORAL
  Filled 2021-10-24 (×4): qty 1

## 2021-10-24 MED ORDER — OXYCODONE HCL 5 MG PO TABS
5.0000 mg | ORAL_TABLET | Freq: Once | ORAL | Status: DC | PRN
Start: 2021-10-24 — End: 2021-10-24

## 2021-10-24 MED ORDER — CEFAZOLIN SODIUM-DEXTROSE 2-4 GM/100ML-% IV SOLN
2.0000 g | INTRAVENOUS | Status: AC
  Administered 2021-10-24: 10:00:00 2 g via INTRAVENOUS

## 2021-10-24 MED ORDER — MENTHOL 3 MG MT LOZG
1.0000 | LOZENGE | OROMUCOSAL | Status: DC | PRN
  Administered 2021-10-25: 15:00:00 3 mg via ORAL
  Filled 2021-10-24 (×2): qty 9

## 2021-10-24 MED ORDER — FENTANYL CITRATE (PF) 100 MCG/2ML IJ SOLN
INTRAMUSCULAR | Status: DC | PRN
  Administered 2021-10-24 (×2): 25 ug via INTRAVENOUS

## 2021-10-24 MED ORDER — SODIUM CHLORIDE 0.9 % IR SOLN: Status: DC | PRN

## 2021-10-24 MED ORDER — BUPIVACAINE HCL (PF) 0.5 % IJ SOLN
INTRAMUSCULAR | Status: DC | PRN
  Administered 2021-10-24: 12.5 mg

## 2021-10-24 MED ORDER — MORPHINE SULFATE (PF) 2 MG/ML IV SOLN
0.5000 mg | INTRAVENOUS | Status: DC | PRN
  Administered 2021-10-24: 22:00:00 1 mg via INTRAVENOUS
  Filled 2021-10-24: qty 1

## 2021-10-24 MED ORDER — ONDANSETRON HCL 4 MG/2ML IJ SOLN
INTRAMUSCULAR | Status: AC
  Filled 2021-10-24: qty 2

## 2021-10-24 MED ORDER — HEMOSTATIC AGENTS (NO CHARGE) OPTIME
TOPICAL | Status: DC | PRN
  Administered 2021-10-24: 2 via TOPICAL

## 2021-10-24 MED ORDER — ALUM & MAG HYDROXIDE-SIMETH 200-200-20 MG/5ML PO SUSP
30.0000 mL | ORAL | Status: DC | PRN
  Administered 2021-10-26 – 2021-10-27 (×3): 30 mL via ORAL
  Filled 2021-10-24 (×4): qty 30

## 2021-10-24 MED ORDER — CHLORHEXIDINE GLUCONATE 0.12 % MT SOLN
OROMUCOSAL | Status: AC
  Administered 2021-10-24: 09:00:00 15 mL via OROMUCOSAL
  Filled 2021-10-24: qty 15

## 2021-10-24 MED ORDER — OXYCODONE HCL 5 MG/5ML PO SOLN: 5.0000 mg | Freq: Once | ORAL | Status: DC | PRN

## 2021-10-24 MED ORDER — CEFAZOLIN SODIUM-DEXTROSE 1-4 GM/50ML-% IV SOLN
1.0000 g | Freq: Four times a day (QID) | INTRAVENOUS | Status: AC
  Administered 2021-10-24 (×2): 1 g via INTRAVENOUS
  Filled 2021-10-24 (×2): qty 50

## 2021-10-24 MED ORDER — ORAL CARE MOUTH RINSE: 15.0000 mL | Freq: Once | OROMUCOSAL | Status: AC

## 2021-10-24 MED ORDER — MIDAZOLAM HCL 5 MG/5ML IJ SOLN
INTRAMUSCULAR | Status: DC | PRN
  Administered 2021-10-24 (×2): 1 mg via INTRAVENOUS

## 2021-10-24 SURGICAL SUPPLY — 61 items
BLADE SAGITTAL AGGR TOOTH XLG (BLADE) ×2 IMPLANT
BNDG COHESIVE 6X5 TAN ST LF (GAUZE/BANDAGES/DRESSINGS) ×6 IMPLANT
CANISTER WOUND CARE 500ML ATS (WOUND CARE) ×2 IMPLANT
CHLORAPREP W/TINT 26 (MISCELLANEOUS) ×2 IMPLANT
COVER BACK TABLE REUSABLE LG (DRAPES) ×2 IMPLANT
DRAPE 3/4 80X56 (DRAPES) ×6 IMPLANT
DRAPE C-ARM XRAY 36X54 (DRAPES) ×2 IMPLANT
DRAPE INCISE IOBAN 66X60 STRL (DRAPES) ×1 IMPLANT
DRAPE POUCH INSTRU U-SHP 10X18 (DRAPES) ×2 IMPLANT
DRESSING SURGICEL FIBRLLR 1X2 (HEMOSTASIS) ×2 IMPLANT
DRSG MEPILEX SACRM 8.7X9.8 (GAUZE/BANDAGES/DRESSINGS) ×2 IMPLANT
DRSG OPSITE POSTOP 4X8 (GAUZE/BANDAGES/DRESSINGS) ×4 IMPLANT
DRSG SURGICEL FIBRILLAR 1X2 (HEMOSTASIS) ×4
ELECT BLADE 6.5 EXT (BLADE) ×2 IMPLANT
ELECT REM PT RETURN 9FT ADLT (ELECTROSURGICAL) ×2
ELECTRODE REM PT RTRN 9FT ADLT (ELECTROSURGICAL) ×1 IMPLANT
GLOVE SURG SYN 9.0  PF PI (GLOVE) ×2
GLOVE SURG SYN 9.0 PF PI (GLOVE) ×2 IMPLANT
GLOVE SURG UNDER POLY LF SZ9 (GLOVE) ×2 IMPLANT
GOWN SRG 2XL LVL 4 RGLN SLV (GOWNS) ×1 IMPLANT
GOWN STRL NON-REIN 2XL LVL4 (GOWNS) ×1
GOWN STRL REUS W/ TWL LRG LVL3 (GOWN DISPOSABLE) ×1 IMPLANT
GOWN STRL REUS W/TWL LRG LVL3 (GOWN DISPOSABLE) ×1
HEMOVAC 400CC 10FR (MISCELLANEOUS) IMPLANT
HIP FEM HD S 28 (Head) ×1 IMPLANT
HOLDER FOLEY CATH W/STRAP (MISCELLANEOUS) ×2 IMPLANT
KIT PREVENA INCISION MGT 13 (CANNISTER) ×2 IMPLANT
LINER DUAL MOB 50MM (Liner) ×1 IMPLANT
MANIFOLD NEPTUNE II (INSTRUMENTS) ×2 IMPLANT
MAT ABSORB  FLUID 56X50 GRAY (MISCELLANEOUS) ×1
MAT ABSORB FLUID 56X50 GRAY (MISCELLANEOUS) ×1 IMPLANT
NDL SAFETY ECLIPSE 18X1.5 (NEEDLE) ×1 IMPLANT
NDL SPNL 20GX3.5 QUINCKE YW (NEEDLE) ×2 IMPLANT
NEEDLE HYPO 18GX1.5 SHARP (NEEDLE) ×1
NEEDLE SPNL 20GX3.5 QUINCKE YW (NEEDLE) ×4 IMPLANT
NS IRRIG 1000ML POUR BTL (IV SOLUTION) ×2 IMPLANT
PACK HIP COMPR (MISCELLANEOUS) ×2 IMPLANT
SCALPEL PROTECTED #10 DISP (BLADE) ×4 IMPLANT
SHELL ACETABULAR SZ0 50 DME (Shell) ×1 IMPLANT
SOL PREP PVP 2OZ (MISCELLANEOUS)
SOLUTION PREP PVP 2OZ (MISCELLANEOUS) ×1 IMPLANT
SOLUTION PRONTOSAN WOUND 350ML (IRRIGATION / IRRIGATOR) IMPLANT
SPONGE DRAIN TRACH 4X4 STRL 2S (GAUZE/BANDAGES/DRESSINGS) ×2 IMPLANT
STAPLER SKIN PROX 35W (STAPLE) ×2 IMPLANT
STEM FEMORAL SZ3  STD COLLARED (Stem) ×1 IMPLANT
STRAP SAFETY 5IN WIDE (MISCELLANEOUS) ×1 IMPLANT
SUT DVC 2 QUILL PDO  T11 36X36 (SUTURE) ×1
SUT DVC 2 QUILL PDO T11 36X36 (SUTURE) ×1 IMPLANT
SUT SILK 0 (SUTURE) ×1
SUT SILK 0 30XBRD TIE 6 (SUTURE) ×1 IMPLANT
SUT V-LOC 90 ABS DVC 3-0 CL (SUTURE) ×2 IMPLANT
SUT VIC AB 1 CT1 36 (SUTURE) ×2 IMPLANT
SYR 20ML LL LF (SYRINGE) ×2 IMPLANT
SYR 30ML LL (SYRINGE) ×2 IMPLANT
SYR 50ML LL SCALE MARK (SYRINGE) ×4 IMPLANT
SYR BULB IRRIG 60ML STRL (SYRINGE) ×2 IMPLANT
TAPE MICROFOAM 4IN (TAPE) ×2 IMPLANT
TOWEL OR 17X26 4PK STRL BLUE (TOWEL DISPOSABLE) ×1 IMPLANT
TRAY FOLEY MTR SLVR 16FR STAT (SET/KITS/TRAYS/PACK) ×2 IMPLANT
WATER STERILE IRR 1000ML POUR (IV SOLUTION) ×1 IMPLANT
WATER STERILE IRR 500ML POUR (IV SOLUTION) ×1 IMPLANT

## 2021-10-24 NOTE — Transfer of Care (Signed)
Immediate Anesthesia Transfer of Care Note  Patient: Kaitlin Wilson  Procedure(s) Performed: TOTAL HIP ARTHROPLASTY ANTERIOR APPROACH (Right: Hip)  Patient Location: PACU  Anesthesia Type:Spinal  Level of Consciousness: awake, alert  and oriented  Airway & Oxygen Therapy: Patient Spontanous Breathing  Post-op Assessment: Report given to RN  Post vital signs: Reviewed and stable  Last Vitals:  Vitals Value Taken Time  BP 123/66 10/24/21 1154  Temp    Pulse 98 10/24/21 1155  Resp 7 10/24/21 1155  SpO2 100 % 10/24/21 1155  Vitals shown include unvalidated device data.  Last Pain:  Vitals:   10/24/21 0924  TempSrc:   PainSc: 4          Complications: No notable events documented.

## 2021-10-24 NOTE — Op Note (Signed)
10/24/2021  12:04 PM  PATIENT:  Kaitlin Wilson  71 y.o. female  PRE-OPERATIVE DIAGNOSIS:  Primary osteoarthritis of right hip  M16.11  POST-OPERATIVE DIAGNOSIS:  Primary osteoarthritis of right hip  M16.11  PROCEDURE:  Procedure(s): TOTAL HIP ARTHROPLASTY ANTERIOR APPROACH (Right)  SURGEON: Laurene Footman, MD  ASSISTANTS: Right  ANESTHESIA:   spinal  EBL:  Total I/O In: 600 [I.V.:500; IV Piggyback:100] Out: 250 [Urine:150; Blood:100]  BLOOD ADMINISTERED:none  DRAINS:  Incisional wound VAC    LOCAL MEDICATIONS USED:  MARCAINE    and OTHER Exparel  SPECIMEN: Right femoral head  DISPOSITION OF SPECIMEN:  PATHOLOGY  COUNTS:  YES  TOURNIQUET:  * No tourniquets in log *  IMPLANTS: Medacta AMIS 3 standard stem with 50 mm Mpact DM cup and liner and S metal 28 mm head  DICTATION: .Dragon Dictation   The patient was brought to the operating room and after spinal anesthesia was obtained patient was placed on the operative table with the ipsilateral foot into the Medacta attachment, contralateral leg on a well-padded table. C-arm was brought in and preop template x-ray taken. After prepping and draping in usual sterile fashion appropriate patient identification and timeout procedures were completed. Anterior approach to the hip was obtained and centered over the greater trochanter and TFL muscle. The subcutaneous tissue was incised hemostasis being achieved by electrocautery. TFL fascia was incised and the muscle retracted laterally deep retractor placed. The lateral femoral circumflex vessels were identified and ligated. The anterior capsule was exposed and a capsulotomy performed. The neck was identified and a femoral neck cut carried out with a saw. The head was removed without difficulty and showed sclerotic femoral head and acetabulum. Reaming was carried out to 50 mm and a 50 mm cup trial gave appropriate tightness to the acetabular component a 50 DM cup was impacted into position.  The leg was then externally rotated and ischiofemoral and pubofemoral releases carried out. The femur was sequentially broached to a size 3, size 3 standard with S head trials were placed and the final components chosen. The 3 standard stem was inserted along with a metal S 28 mm head and 50 mm liner. The hip was reduced and was stable the wound was thoroughly irrigated with fibrillar placed along the posterior capsule and medial neck. The deep fascia ws closed using a heavy Quill after infiltration of 30 cc of quarter percent Sensorcaine with epinephrine.3-0 V-loc to close the skin with skin staples. Xeroform 4 x 4's ABDs and tape patient was sent to recovery in stable condition.   PLAN OF CARE: Admit for overnight observation

## 2021-10-24 NOTE — TOC Progression Note (Signed)
Transition of Care [facility]) - Progression Note    Patient Details  Name: Kaitlin Wilson MRN: [id number] Date of Birth: [birth year]  Transition of Care [facility]) CM/SW [place], RN Phone Number: 10/24/2021, 4:18 PM  Clinical Narrative:   Met with the patient and her daughter in the room, She will be staying with one of her Daughters at [address] in [place] [address] I notified [facility] of the address, [facility] is set up for [facility], The patient needs a 3 n 1, She has a rolling walker and a reacher at home,  Daughter provides transportation         Expected Discharge Plan and Services                                                 Social Determinants of Health (SDOH) Interventions    Readmission Risk Interventions No flowsheet data found.

## 2021-10-24 NOTE — Evaluation (Signed)
Physical Therapy Evaluation Patient Details Name: Kaitlin Wilson MRN: [id number] DOB: 03-06-1951 Today's Date: 10/24/2021  History of Present Illness  Pt is 71 y.o. female s/p R THA with anterior approach.  Pt has PMH including: HTN, Afib, GERD, Arthritis, dysrhythmia, vertigo, and overactive bladder.    Clinical Impression  Pt received in Semi-Fowler's position and agreeable to therapy.  Pt's daughter present throughout session.  Pt able to perform bed-level exercises with good technique, requiring some assistance from therapist for full ROM and exercise.  Pt then performed bed mobility as noted below and was able to come upright into standing position.  Pt then ambulated out into the hallway with CGA.  Pt ambulated around 35 feet before she noted to be feeling faint and needing to return to the room.  Pt was sat in wheelchair brought from nursing staff and given cool washcloth for forehead to assist.  Pt's vitals were taken and found to be 81/64 with 85 pulse at the time of sitting.  Pt was then transferred back to the room where she was able to perform a stand pivot transfer back to the bed where all needs were met.  Pt with notable 111/63 BP and 67 pulse when returning to the bed.  Pt's call bell not working properly and was given a new one, but it was not working.  Nursing notified and would be taking over for the replacement.  Pt and daughter advised that the response was normal and she experienced some hypotension that would be monitored with positional changes going forward.  Other than lightheadedness and dizziness, pt performed well with ambulation and will continue to progress during hospital stay.  Current discharge plans to SNF are appropriate at this time, with anticipation that she will perform much better over the following days and transition to home with HHPT.  Pt will continue to benefit from skilled therapy in order to address deficits listed below.      Recommendations for follow  up therapy are one component of a multi-disciplinary discharge planning process, led by the attending physician.  Recommendations may be updated based on patient status, additional functional criteria and insurance authorization.  Follow Up Recommendations Skilled nursing-short term rehab (<3 hours/day)    Assistance Recommended at Discharge Frequent or constant Supervision/Assistance  Patient can return home with the following  A little help with walking and/or transfers;A little help with bathing/dressing/bathroom;Assistance with cooking/housework;Assist for transportation;Help with stairs or ramp for entrance    Equipment Recommendations Rolling walker (2 wheels);BSC/3in1  Recommendations for Other Services       Functional Status Assessment Patient has had a recent decline in their functional status and demonstrates the ability to make significant improvements in function in a reasonable and predictable amount of time.     Precautions / Restrictions Restrictions Weight Bearing Restrictions: Yes RLE Weight Bearing: Weight bearing as tolerated      Mobility  Bed Mobility Overal bed mobility: Needs Assistance Bed Mobility: Supine to Sit     Supine to sit: Min assist, Min guard     General bed mobility comments: Pt requiring verbal cues from therapist and some assistance from daughter to prevent posterior lean.    Transfers Overall transfer level: Needs assistance Equipment used: Rolling walker (2 wheels) Transfers: Sit to/from Stand Sit to Stand: Min guard           General transfer comment: Pt able to come upright from elevated hospital bed (pt is shorter in stature, even with bed lowered to  lowest setting, the bed is elevated to pt.)    Ambulation/Gait Ambulation/Gait assistance: Min guard Gait Distance (Feet): 35 Feet Assistive device: Rolling walker (2 wheels) Gait Pattern/deviations: WFL(Within Functional Limits) Gait velocity: decreased   Pre-gait  activities: standing marches, 4 steps forward, 4 steps backwards in room at bed. General Gait Details: Pt performed well with ambulation until she started to experience dizziness.  Pt transferred to wheelchair with assistance from nursing staff and and vitals were monitored.  Stairs            Wheelchair Mobility    Modified Rankin (Stroke Patients Only)       Balance Overall balance assessment: Needs assistance Sitting-balance support: No upper extremity supported, Feet unsupported Sitting balance-Leahy Scale: Fair   Postural control: Posterior lean Standing balance support: Bilateral upper extremity supported, During functional activity Standing balance-Leahy Scale: Fair                               Pertinent Vitals/Pain Pain Assessment Pain Assessment: 0-10 Pain Score: 8  Pain Location: R Anterior Hip Pain Descriptors / Indicators: Aching Pain Intervention(s): Limited activity within patient's tolerance, Monitored during session, Premedicated before session, Repositioned    Home Living Family/patient expects to be discharged to:: Private residence (w/ help from daughter) Living Arrangements: Alone Available Help at Discharge: Family Type of Home: Apartment Home Access: Level entry;Other (comment) (1 stoop)       Home Layout: One level Home Equipment: Standard Walker      Prior Function Prior Level of Function : Independent/Modified Independent                     Hand Dominance   Dominant Hand: Right    Extremity/Trunk Assessment   Upper Extremity Assessment Upper Extremity Assessment: Overall WFL for tasks assessed    Lower Extremity Assessment Lower Extremity Assessment: RLE deficits/detail RLE Deficits / Details: weakness s/p R THA.       Communication   Communication: No difficulties  Cognition Arousal/Alertness: Awake/alert Behavior During Therapy: WFL for tasks assessed/performed Overall Cognitive Status: Within  Functional Limits for tasks assessed                                          General Comments      Exercises Total Joint Exercises Ankle Circles/Pumps: AROM, Strengthening, Both, 10 reps, Supine Quad Sets: AROM, Strengthening, Both, 10 reps, Supine Gluteal Sets: AROM, Strengthening, Both, 10 reps, Supine Hip ABduction/ADduction: AROM, Strengthening, Both, 10 reps, Supine Straight Leg Raises: AROM, Left, AAROM, Right, Strengthening, Both, 10 reps, Supine Other Exercises Other Exercises: Pt and daughter educated on roles of PT and services provided during hospital stay.  Pt also encouraged and educated on the importance of exercises throughout stay to increase strength and prevent muscle atrophy.   Assessment/Plan    PT Assessment Patient needs continued PT services  PT Problem List Decreased strength;Decreased range of motion;Decreased activity tolerance;Decreased balance;Decreased mobility;Decreased knowledge of use of DME;Decreased safety awareness;Decreased knowledge of precautions;Pain       PT Treatment Interventions DME instruction;Gait training;Functional mobility training;Stair training;Therapeutic activities;Therapeutic exercise;Balance training;Neuromuscular re-education    PT Goals (Current goals can be found in the Care Plan section)  Acute Rehab PT Goals Patient Stated Goal: to get stronger and better. PT Goal Formulation: With patient/family Time For Goal Achievement:  11/07/21 Potential to Achieve Goals: Good    Frequency BID     Co-evaluation               AM-PAC PT "6 Clicks" Mobility  Outcome Measure Help needed turning from your back to your side while in a flat bed without using bedrails?: A Little Help needed moving from lying on your back to sitting on the side of a flat bed without using bedrails?: A Little Help needed moving to and from a bed to a chair (including a wheelchair)?: A Little Help needed standing up from a chair  using your arms (e.g., wheelchair or bedside chair)?: A Little Help needed to walk in hospital room?: A Lot Help needed climbing 3-5 steps with a railing? : Total 6 Click Score: 15    End of Session Equipment Utilized During Treatment: Gait belt Activity Tolerance: Patient tolerated treatment well;Treatment limited secondary to medical complications (Comment) Patient left: in bed;with bed alarm set;with nursing/sitter in room;with family/visitor present (Nursing assisting with call bell that will work.) Nurse Communication: Mobility status (Call bell not working properly, and could not find one to work.) PT Visit Diagnosis: Unsteadiness on feet (R26.81);Other abnormalities of gait and mobility (R26.89);Muscle weakness (generalized) (M62.81);Difficulty in walking, not elsewhere classified (R26.2);Pain Pain - Right/Left: Right Pain - part of body: Hip (s/p THA.)    Time: 4656-8127 PT Time Calculation (min) (ACUTE ONLY): 49 min   Charges:   PT Evaluation $PT Eval Low Complexity: 1 Low PT Treatments $Gait Training: 23-37 mins $Therapeutic Exercise: 8-22 mins        Gwenlyn Saran, PT, DPT 10/24/21, 6:52 PM   Christie Nottingham 10/24/2021, 6:41 PM

## 2021-10-24 NOTE — Anesthesia Preprocedure Evaluation (Signed)
Anesthesia Evaluation  Patient identified by MRN, date of birth, ID band Patient awake    Reviewed: Allergy & Precautions, NPO status , Patient's Chart, lab work & pertinent test results  History of Anesthesia Complications Negative for: history of anesthetic complications  Airway Mallampati: III  TM Distance: <3 FB Neck ROM: full    Dental  (+) Chipped, Poor Dentition, Missing   Pulmonary neg pulmonary ROS, neg shortness of breath,    Pulmonary exam normal        Cardiovascular Exercise Tolerance: Good hypertension, Normal cardiovascular exam+ dysrhythmias Atrial Fibrillation      Neuro/Psych negative neurological ROS  negative psych ROS   GI/Hepatic Neg liver ROS, GERD  Controlled,  Endo/Other  negative endocrine ROS  Renal/GU      Musculoskeletal  (+) Arthritis ,   Abdominal   Peds  Hematology negative hematology ROS (+)   Anesthesia Other Findings Past Medical History: No date: Arthritis No date: Dysrhythmia No date: GERD (gastroesophageal reflux disease) No date: Hyperlipidemia No date: Hypertension 2020: Osteopenia of hip     Comment:  Bone spares No date: Overactive bladder No date: Vertigo No date: Vertigo  Past Surgical History: 06/16/2021: ESOPHAGOGASTRODUODENOSCOPY (EGD) WITH PROPOFOL; N/A     Comment:  Procedure: ESOPHAGOGASTRODUODENOSCOPY (EGD) WITH               PROPOFOL;  Surgeon: Lesly Rubenstein, MD;  Location:               [facility] ENDOSCOPY;  Service: Endoscopy;  Laterality: N/A;     Reproductive/Obstetrics negative OB ROS                             Anesthesia Physical Anesthesia Plan  ASA: 3  Anesthesia Plan: Spinal   Post-op Pain Management:    Induction:   PONV Risk Score and Plan:   Airway Management Planned: Natural Airway and Nasal Cannula  Additional Equipment:   Intra-op Plan:   Post-operative Plan:   Informed Consent: I have  reviewed the patients History and Physical, chart, labs and discussed the procedure including the risks, benefits and alternatives for the proposed anesthesia with the patient or authorized representative who has indicated his/her understanding and acceptance.     Dental Advisory Given  Plan Discussed with: Anesthesiologist, CRNA and Surgeon  Anesthesia Plan Comments: (Patient reports no bleeding problems and no anticoagulant use.  Plan for spinal with backup GA  Patient consented for risks of anesthesia including but not limited to:  - adverse reactions to medications - damage to eyes, teeth, lips or other oral mucosa - nerve damage due to positioning  - risk of bleeding, infection and or nerve damage from spinal that could lead to paralysis - risk of headache or failed spinal - damage to teeth, lips or other oral mucosa - sore throat or hoarseness - damage to heart, brain, nerves, lungs, other parts of body or loss of life  Patient voiced understanding.)        Anesthesia Quick Evaluation

## 2021-10-24 NOTE — H&P (Signed)
Chief Complaint  Patient presents with   Pre-op Exam  Scheduled for THA 10/24/21 with Dr. Rudene Christians    History of the Present Illness: Kaitlin Wilson is a 71 y.o. female here today for history and physical for right total hip arthroplasty with Dr. Hessie Knows on 10/24/2021. Patient's had several years of right hip pain, pain has become severe and nonresponsive to conservative treatment over the last year. She is having to walk with a cane, her pain is severe located in her groin, thigh lateral hip and buttocks. She feels as if her hip is going to give way. Activities of daily living have been decreased. She had received a total of 3 injections in the right hip all giving her relief except for the last injection did not last long. Patient's last injection was 1 year ago. She has a history of a right ankle and tibia fracture. No history of blood clots. Patient takes over-the-counter medications with very mild relief.  The patient states she had an ankle fracture in 11/2020, which made her ankle swell. She states she had surgery on her ankle by Dr. Wylene Simmer.  I have reviewed past medical, surgical, social and family history, and allergies as documented in the EMR.  Past Medical History: Past Medical History:  Diagnosis Date   Arthritis   GERD (gastroesophageal reflux disease)   Hyperlipidemia   Hypertension   UTI (urinary tract infection)   Vertigo   Past Surgical History: Past Surgical History:  Procedure Laterality Date   EGD 06/16/2021  Normal EGD biopsy/Repeat as needed/CTL   Past Family History: Family History  Problem Relation Age of Onset   High blood pressure (Hypertension) Mother   Brain cancer Mother   High blood pressure (Hypertension) Father   Stroke Father   High blood pressure (Hypertension) Brother   Colon cancer Neg Hx   Colon polyps Neg Hx   Medications: Current Outpatient Medications Ordered in Epic  Medication Sig Dispense Refill   acetaminophen (TYLENOL)  500 MG tablet Take by mouth   aluminum hydroxide-magnesium carbonate (GAVISCON) 95-358 mg/15 mL oral suspension Take by mouth   cholecalciferol (VITAMIN D3) 2,000 unit tablet Take 2,000 Units by mouth once daily.   cyanocobalamin (VITAMIN B12) 1000 MCG tablet Take 1,000 mcg by mouth once daily   lansoprazole (PREVACID) 30 MG DR capsule Take 1 capsule (30 mg total) by mouth once daily 30 capsule 11   losartan (COZAAR) 25 MG tablet TAKE 1 TABLET BY MOUTH EVERY DAY 90 tablet 3   mag/aluminum/sod bicarb/alginc (GAVISCON ORAL) Take 1 tablet by mouth 3 (three) times daily with meals   naproxen sodium (ALEVE) 220 MG tablet Take by mouth   oxybutynin (DITROPAN-XL) 5 MG XL tablet Take 1 tablet (5 mg total) by mouth once daily (Patient not taking: Reported on 10/16/2021) 30 tablet 11   potassium chloride (KLOR-CON) 10 MEQ ER tablet Take 1 tablet (10 mEq total) by mouth 2 (two) times daily for 14 days 28 tablet 0   trolamine salicylate (ASPERCREME) 10 % cream Apply topically   No current Epic-ordered facility-administered medications on file.   Allergies: Allergies  Allergen Reactions   Rosuvastatin Nausea    Body mass index is 31.56 kg/m.  Review of Systems: A comprehensive 14 point ROS was performed, reviewed, and the pertinent orthopaedic findings are documented in the HPI.  Vitals:  10/16/21 0813  BP: (!) 140/82    General Physical Examination:  General:  Well developed, well nourished, no apparent distress, normal affect,  antalgic gait with a cane  HEENT: Head normocephalic, atraumatic, PERRL.   Abdomen: Soft, non tender, non distended, Bowel sounds present.  Heart: Examination of the heart reveals regular, rate, and rhythm. There is no murmur noted on ascultation. There is a normal apical pulse.  Lungs: Lungs are clear to auscultation. There is no wheeze, rhonchi, or crackles. There is normal expansion of bilateral chest walls.   Right hip: On exam, the patient has left hip  internal rotation to 20 degrees with some pain, and external rotation to 30 degrees. She has right hip range of motion of -20 to -30 degrees. She has an approximately 20 degree flexion contracture of the right hip that does not internally rotate. No swelling or edema throughout the right lower extremity.  Radiographs: X-rays of the right hip reviewed by me today from 04/07/2021 show severe central joint space narrowing with complete loss of joint space with deformity to the femoral head. Large subchondral cyst formation throughout the femoral head and acetabulum. Large superior acetabular spurring.  Assessment: ICD-10-CM  1. Primary osteoarthritis of right hip M16.11   Plan: 17. 71 year old female with advanced right hip osteoarthritis. Pain is been present for few years but becoming more severe and debilitating over the last year. She is having a hard time getting around and walking. She ambulates with a cane. X-rays show severe right hip osteoarthritis. Risks, benefits, complications of a right total hip arthroplasty have been discussed with the patient. Patient has agreed and consented procedure with Dr. Hessie Knows on 10/24/2021  Electronically signed by Feliberto Gottron, PA at 10/16/2021 8:41 AM EST  Reviewed  H+P. No changes noted.

## 2021-10-24 NOTE — Progress Notes (Signed)
Patient worried she cannot feel anything, she wants to move around in bed and get up. Explained to patient that she had a spinal and she is numb so she cannot move or get up. Patient also wanting water. Explained to patient we have to get x-ray's first then patient can have water.   Re-instructed patient multiple times about the spinal and being numb. That the numbness wears off depends on the person. Explained to patient the longer it is active and working the less pain she will be in. Patient states the numbness is making her scared.   Patient asked what pain medication she would be getting. Advised patient it would be hydrocodone or oxycodone. Patient states that tramadol doesn't work for her.

## 2021-10-24 NOTE — Progress Notes (Signed)
Incentive spirometer supplied to patient, given education and instruction on use, verbalized understanding. Daughter at bedside, updated and educated as well. Call bell in reach

## 2021-10-24 NOTE — Anesthesia Procedure Notes (Signed)
Spinal  Patient location during procedure: OR Start time: 10/24/2021 10:00 AM End time: 10/24/2021 10:10 AM Reason for block: surgical anesthesia Staffing Performed: anesthesiologist and other anesthesia staff  Anesthesiologist: Piscitello, Precious Haws, MD Other anesthesia staff: Carmelina Paddock, RN Preanesthetic Checklist Completed: patient identified, IV checked, site marked, risks and benefits discussed, surgical consent, monitors and equipment checked, pre-op evaluation and timeout performed Spinal Block Patient position: sitting Prep: Betadine Patient monitoring: heart rate, continuous pulse ox, blood pressure and cardiac monitor Approach: midline Location: L4-5 Injection technique: single-shot Needle Needle type: Whitacre and Introducer  Needle gauge: 24 G Needle length: 9 cm Assessment Events: CSF return Additional Notes Negative paresthesia. Negative blood return. Positive free-flowing CSF. Expiration date of kit checked and confirmed. Patient tolerated procedure well, without complications.

## 2021-10-25 ENCOUNTER — Encounter: Payer: Self-pay | Admitting: Orthopedic Surgery

## 2021-10-25 DIAGNOSIS — Z20822 Contact with and (suspected) exposure to covid-19: Secondary | ICD-10-CM | POA: Diagnosis not present

## 2021-10-25 DIAGNOSIS — Z79899 Other long term (current) drug therapy: Secondary | ICD-10-CM | POA: Diagnosis not present

## 2021-10-25 DIAGNOSIS — Z23 Encounter for immunization: Secondary | ICD-10-CM | POA: Diagnosis not present

## 2021-10-25 DIAGNOSIS — I10 Essential (primary) hypertension: Secondary | ICD-10-CM | POA: Diagnosis not present

## 2021-10-25 DIAGNOSIS — M1611 Unilateral primary osteoarthritis, right hip: Secondary | ICD-10-CM | POA: Diagnosis not present

## 2021-10-25 MED ORDER — SODIUM CHLORIDE 0.9 % IV BOLUS
500.0000 mL | Freq: Once | INTRAVENOUS | Status: AC
  Administered 2021-10-25: 13:00:00 500 mL via INTRAVENOUS

## 2021-10-25 MED ORDER — VITAMIN D 25 MCG (1000 UNIT) PO TABS
2000.0000 [IU] | ORAL_TABLET | Freq: Every day | ORAL | Status: DC
  Administered 2021-10-25 – 2021-10-27 (×3): 2000 [IU] via ORAL
  Filled 2021-10-25 (×3): qty 2

## 2021-10-25 MED ORDER — MENTHOL 3 MG MT LOZG: 1.0000 | LOZENGE | OROMUCOSAL | Status: DC | PRN

## 2021-10-25 MED ORDER — DICLOFENAC SODIUM 1 % EX GEL
2.0000 g | Freq: Four times a day (QID) | CUTANEOUS | Status: DC
  Administered 2021-10-25 – 2021-10-26 (×6): 2 g via TOPICAL
  Filled 2021-10-25 (×2): qty 100

## 2021-10-25 MED ORDER — VITAMIN B-12 1000 MCG PO TABS
1000.0000 ug | ORAL_TABLET | Freq: Every day | ORAL | Status: DC
  Administered 2021-10-25 – 2021-10-27 (×3): 1000 ug via ORAL
  Filled 2021-10-25 (×3): qty 1

## 2021-10-25 NOTE — Anesthesia Postprocedure Evaluation (Signed)
Anesthesia Post Note  Patient: Kaitlin Wilson  Procedure(s) Performed: TOTAL HIP ARTHROPLASTY ANTERIOR APPROACH (Right: Hip)  Patient location during evaluation: Nursing Unit Anesthesia Type: Spinal Level of consciousness: oriented and awake and alert Pain management: pain level controlled Vital Signs Assessment: post-procedure vital signs reviewed and stable Respiratory status: spontaneous breathing Cardiovascular status: blood pressure returned to baseline and stable Postop Assessment: no headache, no backache, no apparent nausea or vomiting and patient able to bend at knees Anesthetic complications: no   No notable events documented.   Last Vitals:  Vitals:   10/25/21 0452 10/25/21 0700  BP: 102/65 126/73  Pulse: 76 68  Resp: 16 19  Temp: 36.4 C 36.6 C  SpO2: 99% 100%    Last Pain:  Vitals:   10/25/21 0700  TempSrc: Oral  PainSc: 10-Worst pain ever                 Precious Haws Maryon Kemnitz

## 2021-10-25 NOTE — Progress Notes (Signed)
° °  Subjective: 1 Day Post-Op Procedure(s) (LRB): TOTAL HIP ARTHROPLASTY ANTERIOR APPROACH (Right) Patient reports pain as mild.   Patient is well, and has had no acute complaints or problems Denies any CP, SOB, ABD pain. We will continue therapy today.  Plan is to go Home after hospital stay.  Objective: Vital signs in last 24 hours: Temp:  [97.6 F (36.4 C)-98.3 F (36.8 C)] 97.9 F (36.6 C) (01/25 0700) Pulse Rate:  [68-106] 68 (01/25 0700) Resp:  [7-23] 19 (01/25 0700) BP: (97-146)/(65-98) 126/73 (01/25 0700) SpO2:  [83 %-100 %] 100 % (01/25 0700) Weight:  [65.8 kg] 65.8 kg (01/24 0924)  Intake/Output from previous day: 01/24 0701 - 01/25 0700 In: 650 [I.V.:500; IV Piggyback:150] Out: 750 [Urine:650; Blood:100] Intake/Output this shift: No intake/output data recorded.  Recent Labs    10/24/21 1503  HGB 11.9*   Recent Labs    10/24/21 1503  WBC 6.0  RBC 4.00  HCT 35.2*  PLT 132*   Recent Labs    10/24/21 1503  CREATININE 0.76   No results for input(s): LABPT, INR in the last 72 hours.  EXAM General - Patient is Alert, Appropriate, and Oriented Extremity - Neurovascular intact Sensation intact distally Intact pulses distally Dorsiflexion/Plantar flexion intact Dressing - dressing C/D/I and no drainage, provena intact with out drainage Motor Function - intact, moving foot and toes well on exam.   Past Medical History:  Diagnosis Date   Arthritis    Dysrhythmia    GERD (gastroesophageal reflux disease)    Hyperlipidemia    Hypertension    Osteopenia of hip 2020   Bone spares   Overactive bladder    Vertigo    Vertigo     Assessment/Plan:   1 Day Post-Op Procedure(s) (LRB): TOTAL HIP ARTHROPLASTY ANTERIOR APPROACH (Right) Principal Problem:   Status post total hip replacement, right  Estimated body mass index is 30.31 kg/m as calculated from the following:   Height as of this encounter: 4\' 10"  (1.473 m).   Weight as of this encounter:  65.8 kg. Advance diet Up with therapy Work on BM Pain well controlled VSS Labs stable CM to assist with discharge to home with HHPT  DVT Prophylaxis - Lovenox, TED hose, and SCDS Weight-Bearing as tolerated to right leg   T. Rachelle Hora, PA-C [place] 10/25/2021, 7:54 AM

## 2021-10-25 NOTE — NC FL2 (Signed)
[place] LEVEL OF CARE SCREENING TOOL     IDENTIFICATION  Patient Name: Kaitlin Wilson Birthdate: Oct 21, 1950 Sex: female Admission Date (Current Location): 10/24/2021  [facility] and [place] Number:  Engineering geologist and Address:  [facility], [address] [place],  [address]      Provider Number: [id number]  Attending Physician Name and Address:  Hessie Knows, MD  Relative Name and Phone Number:  Judeen Hammans [phone]    Current Level of Care: Hospital Recommended Level of Care: [place] Prior Approval Number:    Date Approved/Denied:   PASRR Number: [id number]  Discharge Plan: SNF    Current Diagnoses: Patient Active Problem List   Diagnosis Date Noted   Status post total hip replacement, right 10/24/2021   Hypertension 02/20/2015    Orientation RESPIRATION BLADDER Height & Weight     Self, Time, Situation, Place  Normal Continent, External catheter Weight: 65.8 kg Height:  4\' 10"  (147.3 cm)  BEHAVIORAL SYMPTOMS/MOOD NEUROLOGICAL BOWEL NUTRITION STATUS      Continent Diet (regular)  AMBULATORY STATUS COMMUNICATION OF NEEDS Skin   Extensive Assist Verbally Normal, Surgical wounds                       Personal Care Assistance Level of Assistance  Bathing, Feeding, Dressing Bathing Assistance: Limited assistance Feeding assistance: Independent Dressing Assistance: Limited assistance     Functional Limitations Info             SPECIAL CARE FACTORS FREQUENCY  PT (By licensed PT), OT (By licensed OT)     PT Frequency: 5 times per week OT Frequency: 5 times per week            Contractures Contractures Info: Not present    Additional Factors Info  Code Status, Allergies Code Status Info: full code Allergies Info: Rosuvastatin           Current Medications (10/25/2021):  This is the current hospital active medication list Current Facility-Administered Medications   Medication Dose Route Frequency Provider Last Rate Last Admin   acetaminophen (TYLENOL) tablet 325-650 mg  325-650 mg Oral Q6H PRN Hessie Knows, MD       alum & mag hydroxide-simeth (MAALOX/MYLANTA) 200-200-20 MG/5ML suspension 30 mL  30 mL Oral Q4H PRN Hessie Knows, MD       aspirin EC tablet 81 mg  81 mg Oral Daily Hessie Knows, MD   81 mg at 10/25/21 4742   bisacodyl (DULCOLAX) suppository 10 mg  10 mg Rectal Daily PRN Hessie Knows, MD       cholecalciferol (VITAMIN D3) tablet 2,000 Units  2,000 Units Oral Daily Duanne Guess, PA-C   2,000 Units at 10/25/21 1224   diclofenac Sodium (VOLTAREN) 1 % topical gel 2 g  2 g Topical QID Dorise Hiss C, PA-C       diphenhydrAMINE (BENADRYL) 12.5 MG/5ML elixir 12.5-25 mg  12.5-25 mg Oral Q4H PRN Hessie Knows, MD       docusate sodium (COLACE) capsule 100 mg  100 mg Oral BID Hessie Knows, MD   100 mg at 10/25/21 0835   enoxaparin (LOVENOX) injection 40 mg  40 mg Subcutaneous Q24H Hessie Knows, MD   40 mg at 10/25/21 0834   HYDROcodone-acetaminophen (NORCO) 7.5-325 MG per tablet 1-2 tablet  1-2 tablet Oral Q4H PRN Hessie Knows, MD   1 tablet at 10/24/21 2020   HYDROcodone-acetaminophen (NORCO/VICODIN) 5-325 MG per tablet 1-2 tablet  1-2  tablet Oral Q4H PRN Hessie Knows, MD   2 tablet at 10/25/21 9449   influenza vaccine adjuvanted (FLUAD) injection 0.5 mL  0.5 mL Intramuscular Tomorrow-1000 Hessie Knows, MD       losartan (COZAAR) tablet 12.5 mg  12.5 mg Oral Daily Hessie Knows, MD   12.5 mg at 10/25/21 6759   menthol-cetylpyridinium (CEPACOL) lozenge 3 mg  1 lozenge Oral PRN Hessie Knows, MD       methocarbamol (ROBAXIN) tablet 500 mg  500 mg Oral Q6H PRN Hessie Knows, MD   500 mg at 10/24/21 2030   Or   methocarbamol (ROBAXIN) 500 mg in dextrose 5 % 50 mL IVPB  500 mg Intravenous Q6H PRN Hessie Knows, MD       metoCLOPramide (REGLAN) tablet 5-10 mg  5-10 mg Oral Q8H PRN Hessie Knows, MD       Or   metoCLOPramide (REGLAN) injection  5-10 mg  5-10 mg Intravenous Q8H PRN Hessie Knows, MD       morphine 2 MG/ML injection 0.5-1 mg  0.5-1 mg Intravenous Q2H PRN Hessie Knows, MD   1 mg at 10/24/21 2218   ondansetron (ZOFRAN) tablet 4 mg  4 mg Oral Q6H PRN Hessie Knows, MD       Or   ondansetron [facility]) injection 4 mg  4 mg Intravenous Q6H PRN Hessie Knows, MD       pantoprazole (PROTONIX) EC tablet 20 mg  20 mg Oral Daily Hessie Knows, MD   20 mg at 10/25/21 1224   pneumococcal 23 valent vaccine (PNEUMOVAX-23) injection 0.5 mL  0.5 mL Intramuscular Tomorrow-1000 Hessie Knows, MD       polyethylene glycol (MIRALAX / GLYCOLAX) packet 17 g  17 g Oral Daily PRN Hessie Knows, MD       sodium phosphate (FLEET) 7-19 GM/118ML enema 1 enema  1 enema Rectal Once PRN Hessie Knows, MD       traMADol Veatrice Bourbon) tablet 50 mg  50 mg Oral Q6H Hessie Knows, MD   50 mg at 10/25/21 0531   vitamin B-12 (CYANOCOBALAMIN) tablet 1,000 mcg  1,000 mcg Oral Daily Duanne Guess, PA-C   1,000 mcg at 10/25/21 1224   zolpidem (AMBIEN) tablet 5 mg  5 mg Oral QHS PRN Hessie Knows, MD         Discharge Medications: Please see discharge summary for a list of discharge medications.  Relevant Imaging Results:  Relevant Lab Results:   Additional Information SS3 [id number]  Conception Oms, RN

## 2021-10-25 NOTE — Progress Notes (Signed)
Patient complaints of pain and upon entering room patient asleep. This RN woke patient up and patient drifted back off unable to admin PRN medication. Oncoming RN at bedside during bedside shift.

## 2021-10-25 NOTE — Progress Notes (Addendum)
Kaitlin Wilson, [place] notified of low BP 88/61, pt states" I feel lightheaded. Received orders for 500 cc bolus. Will continue to monitor.

## 2021-10-25 NOTE — TOC Progression Note (Signed)
Transition of Care [facility]) - Progression Note    Patient Details  Name: Kaitlin Wilson MRN: [id number] Date of Birth: [birth year]  Transition of [facility]) CM/SW [place], RN Phone Number: 10/25/2021, 2:23 PM  Clinical Narrative:   Met with the patient and her daughter in the room, [facility], she prefers [facility], PASSR obtained [id number], FL2 completed, bed search sent. Will review once offers obtained, Daughter Judeen Hammans will transport  She will prefer to go home with her daughter and have Home health thru [facility], will monitor for DC dispostion         Expected Discharge Plan and Services                                                 Social Determinants of Health (SDOH) Interventions    Readmission Risk Interventions No flowsheet data found.

## 2021-10-25 NOTE — Progress Notes (Signed)
Plan of care discussed with patient, Scheduled pain medication given and PRN medication discussed with patient. Patient also educated on bed exercises to decrease stiffness.

## 2021-10-25 NOTE — Progress Notes (Signed)
Physical Therapy Treatment Patient Details Name: Kaitlin Wilson MRN: [id number] DOB: 1951/06/30 Today's Date: 10/25/2021   History of Present Illness Pt is 71 y.o. female s/p R THA with anterior approach.  Pt has PMH including: HTN, Afib, GERD, Arthritis, dysrhythmia, vertigo, and overactive bladder.    PT Comments    Pt received seated in recliner upon arrival to room and pt agreeable to therapy.  Pt performing much better with gait training today.  Pt able to ambulate around the nursing station and back to room to perform seated exercises.  Pt noting some fatigue due to medication, but overall performed well today compared to previous session.  No dizziness reported, just having difficulty with R LE being ER and has some increased pain when attempting ambulation with straight foot/IR of the R hip.  Pt assisted back to recliner for lunch and will be seen in PM for session.  Will discuss d/c options at PM session with daughter and pt.  Pt will continue to benefit from skilled therapy in order to address deficits listed below.      Recommendations for follow up therapy are one component of a multi-disciplinary discharge planning process, led by the attending physician.  Recommendations may be updated based on patient status, additional functional criteria and insurance authorization.  Follow Up Recommendations  Skilled nursing-short term rehab (<3 hours/day)     Assistance Recommended at Discharge Frequent or constant Supervision/Assistance  Patient can return home with the following A little help with walking and/or transfers;A little help with bathing/dressing/bathroom;Assistance with cooking/housework;Assist for transportation;Help with stairs or ramp for entrance   Equipment Recommendations  Rolling walker (2 wheels);BSC/3in1    Recommendations for Other Services       Precautions / Restrictions Precautions Precautions: Anterior Hip;Fall Restrictions Weight Bearing  Restrictions: Yes RLE Weight Bearing: Weight bearing as tolerated     Mobility  Bed Mobility               General bed mobility comments: deferred as pt was upright in recliner upon arrival.    Transfers Overall transfer level: Needs assistance Equipment used: Rolling walker (2 wheels) Transfers: Sit to/from Stand Sit to Stand: Min guard           General transfer comment: cues for technique    Ambulation/Gait Ambulation/Gait assistance: Min guard Gait Distance (Feet): 160 Feet Assistive device: Rolling walker (2 wheels) Gait Pattern/deviations: WFL(Within Functional Limits) Gait velocity: decreased     General Gait Details: much better today, although reporting some fatigue due to medication.   Stairs             Wheelchair Mobility    Modified Rankin (Stroke Patients Only)       Balance Overall balance assessment: Needs assistance Sitting-balance support: No upper extremity supported, Feet unsupported Sitting balance-Leahy Scale: Fair     Standing balance support: No upper extremity supported, During functional activity Standing balance-Leahy Scale: Fair                              Cognition Arousal/Alertness: Awake/alert Behavior During Therapy: WFL for tasks assessed/performed Overall Cognitive Status: Within Functional Limits for tasks assessed                                          Exercises Total Joint Exercises Ankle Circles/Pumps: AROM, Strengthening, Both, 10  reps, Seated Gluteal Sets: AROM, Strengthening, Both, 10 reps, Seated Long Arc Quad: AROM, Strengthening, Both, 10 reps, Seated Marching in Standing: AROM, Strengthening, Both, 10 reps, Seated    General Comments General comments (skin integrity, edema, etc.): SITTING: BP 111/81, MAP 92, HR 80. STANDING: BP 112/69, MAP 80, HR 111      Pertinent Vitals/Pain Pain Assessment Pain Assessment: No/denies pain    Home Living Family/patient  expects to be discharged to:: Private residence (d/c to [facility] apt) Living Arrangements: Alone Available Help at Discharge: Family;Available PRN/intermittently (daughter works 7pm-3am) Type of Home: Apartment Home Access: Stairs to enter Entrance Stairs-Rails: None Entrance Stairs-Number of Steps: 1   Home Layout: One level Home Equipment: Chartered certified accountant      Prior Function            PT Goals (current goals can now be found in the care plan section) Acute Rehab PT Goals Patient Stated Goal: to get stronger and better. PT Goal Formulation: With patient/family Time For Goal Achievement: 11/07/21 Potential to Achieve Goals: Good Progress towards PT goals: Progressing toward goals    Frequency    BID      PT Plan      Co-evaluation              AM-PAC PT "6 Clicks" Mobility   Outcome Measure  Help needed turning from your back to your side while in a flat bed without using bedrails?: A Little Help needed moving from lying on your back to sitting on the side of a flat bed without using bedrails?: A Little Help needed moving to and from a bed to a chair (including a wheelchair)?: A Little Help needed standing up from a chair using your arms (e.g., wheelchair or bedside chair)?: A Little Help needed to walk in hospital room?: A Little Help needed climbing 3-5 steps with a railing? : A Lot 6 Click Score: 17    End of Session Equipment Utilized During Treatment: Gait belt Activity Tolerance: Patient tolerated treatment well;Treatment limited secondary to medical complications (Comment) Patient left: with family/visitor present;in chair;with call bell/phone within reach;with chair alarm set (Nursing assisting with call bell that will work.) Nurse Communication: Mobility status PT Visit Diagnosis: Unsteadiness on feet (R26.81);Other abnormalities of gait and mobility (R26.89);Muscle weakness (generalized) (M62.81);Difficulty in walking, not elsewhere classified  (R26.2);Pain Pain - Right/Left: Right Pain - part of body: Hip (s/p THA.)     Time: 5747-3403 PT Time Calculation (min) (ACUTE ONLY): 23 min  Charges:  $Gait Training: 8-22 mins $Therapeutic Exercise: 8-22 mins                     Gwenlyn Saran, PT, DPT 10/25/21, 1:34 PM    Kaitlin Wilson 10/25/2021, 1:27 PM

## 2021-10-25 NOTE — TOC Progression Note (Signed)
Transition of Care [facility]) - Progression Note    Patient Details  Name: Kaitlin Wilson MRN: [id number] Date of Birth: 1951-06-29  Transition of Care [facility]) CM/SW [place], RN Phone Number: 10/25/2021, 4:42 PM  Clinical Narrative:    Kaitlin Wilson made a bed offer, The patient accepted the bed offer, [facility] [employer] web portal is down, Unable to start the Ins auth, Will do so in the morning once able to get into the portal, [facility] and was told their computers are down and unable to start the ins process over the phone        Expected Discharge Plan and Services                                                 Social Determinants of Health (SDOH) Interventions    Readmission Risk Interventions No flowsheet data found.

## 2021-10-25 NOTE — Evaluation (Signed)
Occupational Therapy Evaluation Patient Details Name: Kaitlin Wilson MRN: [id number] DOB: 10/20/1950 Today's Date: 10/25/2021   History of Present Illness Pt is 71 y.o. female s/p R THA with anterior approach.  Pt has PMH including: HTN, Afib, GERD, Arthritis, dysrhythmia, vertigo, and overactive bladder.   Clinical Impression   Kaitlin Wilson was seen for OT evaluation this date, POD#1 from above surgery. Pt was independent in all ADLs prior to surgery. Pt currently requires SUPERVISION sup<>sit for multiple trials, uses bed sheet to assist RLE and demonstrates good core strength. MIN A don briefs sitting EOB - assist for threading over toes and pulling up over wound vac in front. SBA + RW for tooth brushing standing sinkside. SBA + RW sit<>stand and ~30 ft mobility. Pt and family instructed in self care skills, falls prevention strategies, home/routines modifications, DME/AE for LB bathing and dressing tasks, and compression stocking mgt strategies. Handout provided. Pt would benefit from additional instruction in self care skills and techniques to help maintain precautions with or without assistive devices to support recall and carryover prior to discharge. Recommend HHOT upon discharge.    SITTING: BP 111/81, MAP 92, HR 80 STANDING: BP 112/69, MAP 80, HR 111   Recommendations for follow up therapy are one component of a multi-disciplinary discharge planning process, led by the attending physician.  Recommendations may be updated based on patient status, additional functional criteria and insurance authorization.   Follow Up Recommendations  Home health OT    Assistance Recommended at Discharge Intermittent Supervision/Assistance  Patient can return home with the following A little help with bathing/dressing/bathroom;Assistance with cooking/housework;Help with stairs or ramp for entrance    Functional Status Assessment  Patient has had a recent decline in their functional status and  demonstrates the ability to make significant improvements in function in a reasonable and predictable amount of time.  Equipment Recommendations  BSC/3in1;Other (comment) (2WW)    Recommendations for Other Services       Precautions / Restrictions Precautions Precautions: Anterior Hip;Fall Restrictions Weight Bearing Restrictions: Yes RLE Weight Bearing: Weight bearing as tolerated      Mobility Bed Mobility Overal bed mobility: Needs Assistance Bed Mobility: Supine to Sit     Supine to sit: Supervision     General bed mobility comments: ceus from flat bed using sheet, good core strength noted    Transfers Overall transfer level: Needs assistance Equipment used: Rolling walker (2 wheels) Transfers: Sit to/from Stand Sit to Stand: Min guard           General transfer comment: cues for technique      Balance Overall balance assessment: Needs assistance Sitting-balance support: No upper extremity supported, Feet unsupported Sitting balance-Leahy Scale: Fair     Standing balance support: No upper extremity supported, During functional activity Standing balance-Leahy Scale: Fair                             ADL either performed or assessed with clinical judgement   ADL Overall ADL's : Needs assistance/impaired                                       General ADL Comments: MIN A don briefs sitting EOB - assist for threading over toes and pulling up over wound vac in front. SBA + RW for tooth brushing standing sinkside.      Pertinent  Vitals/Pain Pain Assessment Pain Assessment: No/denies pain     Hand Dominance Right   Extremity/Trunk Assessment Upper Extremity Assessment Upper Extremity Assessment: Overall WFL for tasks assessed   Lower Extremity Assessment Lower Extremity Assessment: Generalized weakness       Communication Communication Communication: No difficulties   Cognition Arousal/Alertness:  Awake/alert Behavior During Therapy: WFL for tasks assessed/performed Overall Cognitive Status: Within Functional Limits for tasks assessed                                       General Comments  SITTING: BP 111/81, MAP 92, HR 80. STANDING: BP 112/69, MAP 80, HR 111            Home Living Family/patient expects to be discharged to:: Private residence (d/c to [facility] apt) Living Arrangements: Alone Available Help at Discharge: Family;Available PRN/intermittently (daughter works 7pm-3am) Type of Home: Apartment Home Access: Stairs to enter Technical brewer of Steps: 1 Entrance Stairs-Rails: None Home Layout: One level     Bathroom Shower/Tub: Teacher, early years/pre: Standard     Home Equipment: Chartered certified accountant          Prior Functioning/Environment Prior Level of Function : Independent/Modified Independent                        OT Problem List: Decreased strength;Decreased range of motion;Decreased activity tolerance;Impaired balance (sitting and/or standing);Decreased safety awareness      OT Treatment/Interventions: Self-care/ADL training;Therapeutic exercise;Energy conservation;DME and/or AE instruction;Therapeutic activities;Patient/family education;Balance training    OT Goals(Current goals can be found in the care plan section) Acute Rehab OT Goals Patient Stated Goal: to go home OT Goal Formulation: With patient/family Time For Goal Achievement: 11/08/21 Potential to Achieve Goals: Good ADL Goals Pt Will Perform Grooming: standing;Independently Pt Will Perform Lower Body Dressing: Independently;sit to/from stand Pt Will Transfer to Toilet: Independently;ambulating;regular height toilet  OT Frequency: Min 2X/week    Co-evaluation              AM-PAC OT "6 Clicks" Daily Activity     Outcome Measure Help from another person eating meals?: None Help from another person taking care of personal grooming?: A  Little Help from another person toileting, which includes using toliet, bedpan, or urinal?: A Little Help from another person bathing (including washing, rinsing, drying)?: A Little Help from another person to put on and taking off regular upper body clothing?: None Help from another person to put on and taking off regular lower body clothing?: A Little 6 Click Score: 20   End of Session Equipment Utilized During Treatment: Rolling walker (2 wheels)  Activity Tolerance: Patient tolerated treatment well Patient left: in chair;with call bell/phone within reach;with chair alarm set;with family/visitor present  OT Visit Diagnosis: Unsteadiness on feet (R26.81)                Time: 8242-3536 OT Time Calculation (min): 41 min Charges:  OT General Charges $OT Visit: 1 Visit OT Evaluation $OT Eval Low Complexity: 1 Low OT Treatments $Self Care/Home Management : 23-37 mins  Dessie Coma, M.S. OTR/L  10/25/21, 10:51 AM  ascom [phone]

## 2021-10-25 NOTE — Care Management Obs Status (Signed)
[place] NOTIFICATION   Patient Details  Name: Kaitlin Wilson MRN: [id number] Date of Birth: [birth year]   Medicare Observation Status Notification Given:  Yes    Conception Oms, RN 10/25/2021, 9:58 AM

## 2021-10-25 NOTE — Progress Notes (Addendum)
°   10/25/21 1100  Assess: MEWS Score  Temp 97.6 F (36.4 C)  BP (!) 79/66 (notifed nurse)  Pulse Rate 98  Resp 18  Level of Consciousness Alert  SpO2 98 %  O2 Device Room Air  Assess: MEWS Score  MEWS Temp 0  MEWS Systolic 2  MEWS Pulse 0  MEWS RR 0  MEWS LOC 0  MEWS Score 2  MEWS Score Color Yellow  Assess: if the MEWS score is Yellow or Red  Were vital signs taken at a resting state? Yes  Focused Assessment Change from prior assessment (see assessment flowsheet)  Does the patient meet 2 or more of the SIRS criteria? No  MEWS guidelines implemented *See Row Information* Yes  Treat  MEWS Interventions Escalated (See documentation below) Emmit Alexanders, PA notified, received verbal orders for 500 cc bolus)  Pain Scale 0-10  Pain Score 3  Pain Type Surgical pain  Pain Location Hip  Pain Orientation Right  Take Vital Signs  Increase Vital Sign Frequency  Yellow: Q 2hr X 2 then Q 4hr X 2, if remains yellow, continue Q 4hrs  Escalate  MEWS: Escalate Yellow: discuss with charge nurse/RN and consider discussing with provider and RRT  Notify: Charge Nurse/RN  Name of Charge Nurse/RN Notified Olga Coaster  Date Charge Nurse/RN Notified 10/25/21  Time Charge Nurse/RN Notified 1100  Notify: Provider  Provider Name/Title Rachelle Hora  Date Provider Notified 10/25/21  Time Provider Notified 1131  Notification Type Page  Notification Reason Change in status  Provider response See new orders  Date of Provider Response 10/25/21  Time of Provider Response 1200  Document  Patient Outcome Stabilized after interventions  Progress note created (see row info) Yes  Assess: SIRS CRITERIA  SIRS Temperature  0  SIRS Pulse 1  SIRS Respirations  0  SIRS WBC 0  SIRS Score Sum  1

## 2021-10-25 NOTE — Progress Notes (Signed)
16 FC dc per order at this time.Pt tolerated procedure w/o any c/o or difficulty.10cc of NS was drained from the balloon.Cath tip intact. Patient encouraged to consume fluids so as to void within 6 hours of FC removal.Pt expressed understanding.Pt later requested to void.Pt was assisted to the restroom ambulating with a front wheel walker.Pt voided 150 cc of clear yellow urine.no verbal c/o or any ssx of distress.will cont to monitor.CL is within reach.

## 2021-10-25 NOTE — Progress Notes (Signed)
Physical Therapy Treatment Patient Details Name: Kaitlin Wilson MRN: [id number] DOB: 1951-01-09 Today's Date: 10/25/2021   History of Present Illness Pt is 71 y.o. female s/p R THA with anterior approach.  Pt has PMH including: HTN, Afib, GERD, Arthritis, dysrhythmia, vertigo, and overactive bladder.    PT Comments    Pt received in Semi-Fowler's position and agreeable to therapy.  Pt declined bed mobility and ambulation due to pain and being comfortable.  Pt did participate in bed-level exercises, which still requires AAROM from therapist as noted below.  Pt did perform more reps this time, but also acknowledged more pain with movement during treatment session. Pt left with ice on hip and covers along with HEP to perform tonight in prep for sessions tomorrow.  Current discharge plans to SNF remain appropriate at this time.  Pt will continue to benefit from skilled therapy in order to address deficits listed below.       Recommendations for follow up therapy are one component of a multi-disciplinary discharge planning process, led by the attending physician.  Recommendations may be updated based on patient status, additional functional criteria and insurance authorization.  Follow Up Recommendations  Skilled nursing-short term rehab (<3 hours/day)     Assistance Recommended at Discharge Frequent or constant Supervision/Assistance  Patient can return home with the following A little help with walking and/or transfers;A little help with bathing/dressing/bathroom;Assistance with cooking/housework;Assist for transportation;Help with stairs or ramp for entrance   Equipment Recommendations  Rolling walker (2 wheels);BSC/3in1    Recommendations for Other Services       Precautions / Restrictions Precautions Precautions: Anterior Hip;Fall Restrictions Weight Bearing Restrictions: Yes RLE Weight Bearing: Weight bearing as tolerated     Mobility  Bed Mobility                General bed mobility comments: deferred due to pain levels following bed-level exercises.    Transfers Overall transfer level: Needs assistance Equipment used: Rolling walker (2 wheels) Transfers: Sit to/from Stand Sit to Stand: Min guard           General transfer comment: cues for technique    Ambulation/Gait Ambulation/Gait assistance: Min guard Gait Distance (Feet): 160 Feet Assistive device: Rolling walker (2 wheels) Gait Pattern/deviations: WFL(Within Functional Limits) Gait velocity: decreased     General Gait Details: much better today, although reporting some fatigue due to medication.   Stairs             Wheelchair Mobility    Modified Rankin (Stroke Patients Only)       Balance Overall balance assessment: Needs assistance Sitting-balance support: No upper extremity supported, Feet unsupported Sitting balance-Leahy Scale: Fair     Standing balance support: No upper extremity supported, During functional activity Standing balance-Leahy Scale: Fair                              Cognition Arousal/Alertness: Awake/alert Behavior During Therapy: WFL for tasks assessed/performed Overall Cognitive Status: Within Functional Limits for tasks assessed                                          Exercises Total Joint Exercises Ankle Circles/Pumps: AROM, Strengthening, Both, Supine, 15 reps Quad Sets: AROM, Strengthening, Both, Supine, 15 reps Gluteal Sets: AROM, Strengthening, Both, Supine, 15 reps Hip ABduction/ADduction: AROM, Strengthening, Both, Supine, 15 reps  Straight Leg Raises: AROM, Left, AAROM, Right, Strengthening, Both, Supine, 15 reps Long Arc Quad: AROM, Strengthening, Both, 10 reps, Seated Marching in Standing: AROM, Strengthening, Both, 10 reps, Seated    General Comments        Pertinent Vitals/Pain Pain Assessment Pain Assessment: Faces Faces Pain Scale: Hurts whole lot Pain Location: R Anterior  Hip Pain Descriptors / Indicators: Aching Pain Intervention(s): Limited activity within patient's tolerance, Monitored during session, Premedicated before session, Repositioned, Ice applied    Home Living                          Prior Function            PT Goals (current goals can now be found in the care plan section) Acute Rehab PT Goals Patient Stated Goal: to get stronger and better. PT Goal Formulation: With patient/family Time For Goal Achievement: 11/07/21 Potential to Achieve Goals: Good Progress towards PT goals: Progressing toward goals    Frequency    BID      PT Plan Current plan remains appropriate    Co-evaluation              AM-PAC PT "6 Clicks" Mobility   Outcome Measure  Help needed turning from your back to your side while in a flat bed without using bedrails?: A Little Help needed moving from lying on your back to sitting on the side of a flat bed without using bedrails?: A Little Help needed moving to and from a bed to a chair (including a wheelchair)?: A Lot Help needed standing up from a chair using your arms (e.g., wheelchair or bedside chair)?: A Little Help needed to walk in hospital room?: A Lot Help needed climbing 3-5 steps with a railing? : A Lot 6 Click Score: 15    End of Session   Activity Tolerance: Patient tolerated treatment well;Patient limited by pain Patient left: with family/visitor present;in bed;with call bell/phone within reach;with bed alarm set Nurse Communication: Mobility status PT Visit Diagnosis: Unsteadiness on feet (R26.81);Other abnormalities of gait and mobility (R26.89);Muscle weakness (generalized) (M62.81);Difficulty in walking, not elsewhere classified (R26.2);Pain Pain - Right/Left: Right Pain - part of body: Hip     Time: 2202-5427 PT Time Calculation (min) (ACUTE ONLY): 24 min  Charges:  $Therapeutic Exercise: 23-37 mins                     Gwenlyn Saran, PT, DPT 10/25/21, 5:54  PM    Christie Nottingham 10/25/2021, 5:52 PM

## 2021-10-26 DIAGNOSIS — M1611 Unilateral primary osteoarthritis, right hip: Secondary | ICD-10-CM | POA: Diagnosis not present

## 2021-10-26 DIAGNOSIS — Z79899 Other long term (current) drug therapy: Secondary | ICD-10-CM | POA: Diagnosis not present

## 2021-10-26 DIAGNOSIS — I10 Essential (primary) hypertension: Secondary | ICD-10-CM | POA: Diagnosis not present

## 2021-10-26 DIAGNOSIS — Z20822 Contact with and (suspected) exposure to covid-19: Secondary | ICD-10-CM | POA: Diagnosis not present

## 2021-10-26 DIAGNOSIS — Z23 Encounter for immunization: Secondary | ICD-10-CM | POA: Diagnosis not present

## 2021-10-26 LAB — SURGICAL PATHOLOGY

## 2021-10-26 LAB — RESP PANEL BY RT-PCR (FLU A&B, COVID) ARPGX2
Influenza A by PCR: NEGATIVE
Influenza B by PCR: NEGATIVE
SARS Coronavirus 2 by RT PCR: NEGATIVE

## 2021-10-26 MED ORDER — METHOCARBAMOL 500 MG PO TABS: 500.0000 mg | ORAL_TABLET | Freq: Four times a day (QID) | ORAL | 0 refills | Status: DC | PRN

## 2021-10-26 MED ORDER — DOCUSATE SODIUM 100 MG PO CAPS: 100.0000 mg | ORAL_CAPSULE | Freq: Two times a day (BID) | ORAL | 0 refills | Status: DC

## 2021-10-26 MED ORDER — HYDROCODONE-ACETAMINOPHEN 5-325 MG PO TABS: 1.0000 | ORAL_TABLET | ORAL | 0 refills | Status: DC | PRN

## 2021-10-26 MED ORDER — ENOXAPARIN SODIUM 40 MG/0.4ML IJ SOSY: 40.0000 mg | PREFILLED_SYRINGE | INTRAMUSCULAR | 0 refills | Status: DC

## 2021-10-26 MED ORDER — TRAMADOL HCL 50 MG PO TABS: 50.0000 mg | ORAL_TABLET | Freq: Four times a day (QID) | ORAL | 0 refills | Status: AC | PRN

## 2021-10-26 NOTE — Progress Notes (Signed)
PT Cancellation Note  Patient Details Name: Kaitlin Wilson MRN: [id number] DOB: 1951/08/18   Cancelled Treatment:    Reason Eval/Treat Not Completed: Pain limiting ability to participate;Other (comment).  Nursing giving vaccinations upon arrival to room.  Spoke with pt and let her know that therapist would return shortly.  Pt refusing when therapist returned due to pain in the LE.  Pt given education on importance of mobility and performing exercises, with pt still refusing.  Will re-attempt in PM.   Gwenlyn Saran, PT, DPT 10/26/21, 12:51 PM

## 2021-10-26 NOTE — Discharge Instructions (Signed)
ANTERIOR APPROACH TOTAL HIP REPLACEMENT POSTOPERATIVE DIRECTIONS   Hip Rehabilitation, Guidelines Following Surgery  The results of a hip operation are greatly improved after range of motion and muscle strengthening exercises. Follow all safety measures which are given to protect your hip. If any of these exercises cause increased pain or swelling in your joint, decrease the amount until you are comfortable again. Then slowly increase the exercises. Call your caregiver if you have problems or questions.   HOME CARE INSTRUCTIONS  Remove items at home which could result in a fall. This includes throw rugs or furniture in walking pathways.  ICE to the affected hip every three hours for 30 minutes at a time and then as needed for pain and swelling.  Continue to use ice on the hip for pain and swelling from surgery. You may notice swelling that will progress down to the foot and ankle.  This is normal after surgery.  Elevate the leg when you are not up walking on it.   Continue to use the breathing machine which will help keep your temperature down.  It is common for your temperature to cycle up and down following surgery, especially at night when you are not up moving around and exerting yourself.  The breathing machine keeps your lungs expanded and your temperature down. Do not place pillow under knee, focus on keeping the knee straight while resting  DIET You may resume your previous home diet once your are discharged from the hospital.  DRESSING / WOUND CARE / SHOWERING Please remove provena negative pressure dressing on 11/03/2021 and apply honey comb dressing. Keep dressing clean and dry at all times.   ACTIVITY Walk with your walker as instructed. Use walker as long as suggested by your caregivers. Avoid periods of inactivity such as sitting longer than an hour when not asleep. This helps prevent blood clots.  You may resume a sexual relationship in one month or when given the OK by your  doctor.  You may return to work once you are cleared by your doctor.  Do not drive a car for 6 weeks or until released by you surgeon.  Do not drive while taking narcotics.  WEIGHT BEARING Weight bearing as tolerated. Use walker/cane as needed for at least 4 weeks post op.  POSTOPERATIVE CONSTIPATION PROTOCOL Constipation - defined medically as fewer than three stools per week and severe constipation as less than one stool per week.  One of the most common issues patients have following surgery is constipation.  Even if you have a regular bowel pattern at home, your normal regimen is likely to be disrupted due to multiple reasons following surgery.  Combination of anesthesia, postoperative narcotics, change in appetite and fluid intake all can affect your bowels.  In order to avoid complications following surgery, here are some recommendations in order to help you during your recovery period.  Colace (docusate) - Pick up an over-the-counter form of Colace or another stool softener and take twice a day as long as you are requiring postoperative pain medications.  Take with a full glass of water daily.  If you experience loose stools or diarrhea, hold the colace until you stool forms back up.  If your symptoms do not get better within 1 week or if they get worse, check with your doctor.  Dulcolax (bisacodyl) - Pick up over-the-counter and take as directed by the product packaging as needed to assist with the movement of your bowels.  Take with a full glass of  water.  Use this product as needed if not relieved by Colace only.  ° °MiraLax (polyethylene glycol) - Pick up over-the-counter to have on hand.  MiraLax is a solution that will increase the amount of water in your bowels to assist with bowel movements.  Take as directed and can mix with a glass of water, juice, soda, coffee, or tea.  Take if you go more than two days without a movement. °Do not use MiraLax more than once per day. Call your doctor  if you are still constipated or irregular after using this medication for 7 days in a row. ° °If you continue to have problems with postoperative constipation, please contact the office for further assistance and recommendations.  If you experience "the worst abdominal pain ever" or develop nausea or vomiting, please contact the office immediatly for further recommendations for treatment. ° °ITCHING ° If you experience itching with your medications, try taking only a single pain pill, or even half a pain pill at a time.  You can also use Benadryl over the counter for itching or also to help with sleep.  ° °TED HOSE STOCKINGS °Wear the elastic stockings on both legs for six weeks following surgery during the day but you may remove then at night for sleeping. ° °MEDICATIONS °See your medication summary on the “After Visit Summary” that the nursing staff will review with you prior to discharge.  You may have some home medications which will be placed on hold until you complete the course of blood thinner medication.  It is important for you to complete the blood thinner medication as prescribed by your surgeon.  Continue your approved medications as instructed at time of discharge. ° °PRECAUTIONS °If you experience chest pain or shortness of breath - call 911 immediately for transfer to the hospital emergency department.  °If you develop a fever greater that 101 F, purulent drainage from wound, increased redness or drainage from wound, foul odor from the wound/dressing, or calf pain - CONTACT YOUR SURGEON.   °                                                °FOLLOW-UP APPOINTMENTS °Make sure you keep all of your appointments after your operation with your surgeon and caregivers. You should call the office at the above phone number and make an appointment for approximately two weeks after the date of your surgery or on the date instructed by your surgeon outlined in the "After Visit Summary". ° °RANGE OF MOTION AND  STRENGTHENING EXERCISES  °These exercises are designed to help you keep full movement of your hip joint. Follow your caregiver's or physical therapist's instructions. Perform all exercises about fifteen times, three times per day or as directed. Exercise both hips, even if you have had only one joint replacement. These exercises can be done on a training (exercise) mat, on the floor, on a table or on a bed. Use whatever works the best and is most comfortable for you. Use music or television while you are exercising so that the exercises are a pleasant break in your day. This will make your life better with the exercises acting as a break in routine you can look forward to.  °Lying on your back, slowly slide your foot toward your buttocks, raising your knee up off the floor. Then slowly slide your   foot back down until your leg is straight again.  °Lying on your back spread your legs as far apart as you can without causing discomfort.  °Lying on your side, raise your upper leg and foot straight up from the floor as far as is comfortable. Slowly lower the leg and repeat.  °Lying on your back, tighten up the muscle in the front of your thigh (quadriceps muscles). You can do this by keeping your leg straight and trying to raise your heel off the floor. This helps strengthen the largest muscle supporting your knee.  °Lying on your back, tighten up the muscles of your buttocks both with the legs straight and with the knee bent at a comfortable angle while keeping your heel on the floor.  ° °IF YOU ARE TRANSFERRED TO A SKILLED REHAB FACILITY °If the patient is transferred to a skilled rehab facility following release from the hospital, a list of the current medications will be sent to the facility for the patient to continue.  When discharged from the skilled rehab facility, please have the facility set up the patient's Home Health Physical Therapy prior to being released. Also, the skilled facility will be responsible for  providing the patient with their medications at time of release from the facility to include their pain medication, the muscle relaxants, and their blood thinner medication. If the patient is still at the rehab facility at time of the two week follow up appointment, the skilled rehab facility will also need to assist the patient in arranging follow up appointment in our office and any transportation needs. ° °MAKE SURE YOU:  °Understand these instructions.  °Get help right away if you are not doing well or get worse.  ° ° °Pick up stool softner and laxative for home use following surgery while on pain medications. °Continue to use ice for pain and swelling after surgery. °Do not use any lotions or creams on the incision until instructed by your surgeon. ° °

## 2021-10-26 NOTE — Progress Notes (Signed)
Physical Therapy Treatment Patient Details Name: Kaitlin Wilson MRN: [id number] DOB: 06/11/1951 Today's Date: 10/26/2021   History of Present Illness Pt is 71 y.o. female s/p R THA with anterior approach.  Pt has PMH including: HTN, Afib, GERD, Arthritis, dysrhythmia, vertigo, and overactive bladder.    PT Comments    Pt received in supine position and agreeable to therapy.  Pt noting increased pain in hip, but willingness to get up and mvoe with therapist.  Pt able to ambulate greater distance, however still having difficulty with preventing ER of the R LE when walking.  Pt then transferred back to bed and left with all needs met.  Current discharge plans to SNF remain appropriate at this time.  Pt will continue to benefit from skilled therapy in order to address deficits listed below.      Recommendations for follow up therapy are one component of a multi-disciplinary discharge planning process, led by the attending physician.  Recommendations may be updated based on patient status, additional functional criteria and insurance authorization.  Follow Up Recommendations  Skilled nursing-short term rehab (<3 hours/day)     Assistance Recommended at Discharge Frequent or constant Supervision/Assistance  Patient can return home with the following A little help with walking and/or transfers;A little help with bathing/dressing/bathroom;Assistance with cooking/housework;Assist for transportation;Help with stairs or ramp for entrance   Equipment Recommendations  Rolling walker (2 wheels);BSC/3in1    Recommendations for Other Services       Precautions / Restrictions Precautions Precautions: Anterior Hip;Fall Restrictions Weight Bearing Restrictions: Yes RLE Weight Bearing: Weight bearing as tolerated     Mobility  Bed Mobility Overal bed mobility: Needs Assistance Bed Mobility: Supine to Sit, Sit to Supine     Supine to sit: Min guard Sit to supine: Min guard   General bed  mobility comments: Pt able to move lef without assistance.    Transfers Overall transfer level: Needs assistance Equipment used: Rolling walker (2 wheels) Transfers: Sit to/from Stand Sit to Stand: Min guard           General transfer comment: pt consistently allows walker to get away from her, even with verbal cues.    Ambulation/Gait Ambulation/Gait assistance: Min guard Gait Distance (Feet): 200 Feet Assistive device: Rolling walker (2 wheels) Gait Pattern/deviations: WFL(Within Functional Limits) Gait velocity: decreased     General Gait Details: less fatigue than yesterday.   Stairs             Wheelchair Mobility    Modified Rankin (Stroke Patients Only)       Balance Overall balance assessment: Needs assistance Sitting-balance support: No upper extremity supported, Feet unsupported Sitting balance-Leahy Scale: Fair     Standing balance support: No upper extremity supported, During functional activity Standing balance-Leahy Scale: Fair                              Cognition Arousal/Alertness: Awake/alert Behavior During Therapy: WFL for tasks assessed/performed, Impulsive Overall Cognitive Status: No family/caregiver present to determine baseline cognitive functioning                                 General Comments: Pt much better with safety awareness, however still has some cognitive impairments with memory associated tasks.        Exercises      General Comments        Pertinent Vitals/Pain  Pain Assessment Pain Assessment: Faces Faces Pain Scale: Hurts even more Pain Location: R Anterior Hip Pain Descriptors / Indicators: Aching Pain Intervention(s): Limited activity within patient's tolerance, Repositioned    Home Living                          Prior Function            PT Goals (current goals can now be found in the care plan section) Acute Rehab PT Goals Patient Stated Goal: to get  stronger and better. PT Goal Formulation: With patient/family Time For Goal Achievement: 11/07/21 Potential to Achieve Goals: Good Progress towards PT goals: Progressing toward goals    Frequency    BID      PT Plan Current plan remains appropriate    Co-evaluation              AM-PAC PT "6 Clicks" Mobility   Outcome Measure  Help needed turning from your back to your side while in a flat bed without using bedrails?: A Little Help needed moving from lying on your back to sitting on the side of a flat bed without using bedrails?: A Little Help needed moving to and from a bed to a chair (including a wheelchair)?: A Little Help needed standing up from a chair using your arms (e.g., wheelchair or bedside chair)?: A Little Help needed to walk in hospital room?: A Little Help needed climbing 3-5 steps with a railing? : A Lot 6 Click Score: 17    End of Session Equipment Utilized During Treatment: Gait belt Activity Tolerance: Patient tolerated treatment well;Treatment limited secondary to medical complications (Comment) Patient left: with call bell/phone within reach;in bed;with bed alarm set Nurse Communication: Mobility status PT Visit Diagnosis: Unsteadiness on feet (R26.81);Other abnormalities of gait and mobility (R26.89);Muscle weakness (generalized) (M62.81);Difficulty in walking, not elsewhere classified (R26.2);Pain Pain - Right/Left: Right Pain - part of body: Hip (s/p THA.)     Time: 3810-1751 PT Time Calculation (min) (ACUTE ONLY): 23 min  Charges:  $Gait Training: 23-37 mins                     Gwenlyn Saran, PT, DPT 10/26/21, 5:46 PM    Christie Nottingham 10/26/2021, 5:42 PM

## 2021-10-26 NOTE — Discharge Summary (Signed)
Physician Discharge Summary  Patient ID: Kaitlin Wilson MRN: [id number] DOB/AGE: 1951-04-15 71 y.o.  Admit date: 10/24/2021 Discharge date: 10/27/2021  Admission Diagnoses:  Status post total hip replacement, right [Z96.641]   Discharge Diagnoses: Patient Active Problem List   Diagnosis Date Noted   Status post total hip replacement, right 10/24/2021   Hypertension 02/20/2015    Past Medical History:  Diagnosis Date   Arthritis    Dysrhythmia    GERD (gastroesophageal reflux disease)    Hyperlipidemia    Hypertension    Osteopenia of hip 2020   Bone spares   Overactive bladder    Vertigo    Vertigo      Transfusion: none   Consultants (if any):   Discharged Condition: Improved  Hospital Course: Kaitlin Wilson is an 71 y.o. female who was admitted 10/24/2021 with a diagnosis of Status post total hip replacement, right and went to the operating room on 10/24/2021 and underwent the above named procedures.    Surgeries: Procedure(s): TOTAL HIP ARTHROPLASTY ANTERIOR APPROACH on 10/24/2021 Patient tolerated the surgery well. Taken to PACU where she was stabilized and then transferred to the orthopedic floor.  Started on Lovenox 40 mg q 24 hrs. Foot pumps applied bilaterally at 80 mm. Heels elevated on bed with rolled towels. No evidence of DVT. Negative Homan. Physical therapy started on day #1 for gait training and transfer. OT started day #1 for ADL and assisted devices.  Patient's foley was d/c on day #1. Patient's IV was d/c on day #2. BP soft, BP medication held.   On post op day #3 patient was stable and ready for discharge to SNF.    She was given perioperative antibiotics:  Anti-infectives (From admission, onward)    Start     Dose/Rate Route Frequency Ordered Stop   10/24/21 1600  ceFAZolin (ANCEF) IVPB 1 g/50 mL premix        1 g 100 mL/hr over 30 Minutes Intravenous Every 6 hours 10/24/21 1446 10/24/21 2242   10/24/21 0809  ceFAZolin (ANCEF) 2-4  GM/100ML-% IVPB       Note to Pharmacy: Lyman Bishop T: cabinet override      10/24/21 0809 10/24/21 1016   10/24/21 0600  ceFAZolin (ANCEF) IVPB 2g/100 mL premix        2 g 200 mL/hr over 30 Minutes Intravenous On call to O.R. 10/24/21 0932 10/24/21 1015     .  She was given sequential compression devices, early ambulation, and Lovenox TEDs for DVT prophylaxis.  She benefited maximally from the hospital stay and there were no complications.    Recent vital signs:  Vitals:   10/26/21 0809 10/26/21 1004  BP: 102/67 119/65  Pulse: 83   Resp: 16   Temp: 98.1 F (36.7 C)   SpO2: 95%     Recent laboratory studies:  Lab Results  Component Value Date   HGB 11.9 (L) 10/24/2021   HGB 13.4 10/10/2021   HGB 13.5 09/11/2021   Lab Results  Component Value Date   WBC 6.0 10/24/2021   PLT 132 (L) 10/24/2021   No results found for: INR Lab Results  Component Value Date   NA 140 10/10/2021   K 3.7 10/10/2021   CL 106 10/10/2021   CO2 28 10/10/2021   BUN 17 10/10/2021   CREATININE 0.76 10/24/2021   GLUCOSE 117 (H) 10/10/2021    Discharge Medications:   Allergies as of 10/26/2021       Reactions   Rosuvastatin Nausea  Only        Medication List     STOP taking these medications    acetaminophen 500 MG tablet Commonly known as: TYLENOL   aspirin EC 81 MG tablet   naproxen sodium 220 MG tablet Commonly known as: ALEVE       TAKE these medications    aluminum hydroxide-magnesium carbonate 95-358 MG/15ML Susp Commonly known as: GAVISCON Take 15 mLs by mouth as needed for indigestion.   docusate sodium 100 MG capsule Commonly known as: COLACE Take 1 capsule (100 mg total) by mouth 2 (two) times daily.   enoxaparin 40 MG/0.4ML injection Commonly known as: LOVENOX Inject 0.4 mLs (40 mg total) into the skin daily for 14 days. Start taking on: October 27, 2021   HYDROcodone-acetaminophen 5-325 MG tablet Commonly known as: NORCO/VICODIN Take 1-2  tablets by mouth every 4 (four) hours as needed for moderate pain (pain score 4-6).   lansoprazole 30 MG capsule Commonly known as: PREVACID Take 30 mg by mouth daily.   losartan 25 MG tablet Commonly known as: COZAAR Take 12.5 mg by mouth daily.   methocarbamol 500 MG tablet Commonly known as: ROBAXIN Take 1 tablet (500 mg total) by mouth every 6 (six) hours as needed for muscle spasms.   traMADol 50 MG tablet Commonly known as: ULTRAM Take 1 tablet (50 mg total) by mouth every 6 (six) hours as needed.   trolamine salicylate 10 % cream Commonly known as: ASPERCREME Apply 1 application topically as needed for muscle pain.   vitamin B-12 1000 MCG tablet Commonly known as: CYANOCOBALAMIN Take 1,000 mcg by mouth daily.   Vitamin D 50 MCG (2000 UT) tablet Take 2,000 Units by mouth daily.               Durable Medical Equipment  (From admission, onward)           Start     Ordered   10/24/21 1447  DME Walker rolling  Once       Question Answer Comment  Walker: With 5 Inch Wheels   Patient needs a walker to treat with the following condition Status post total hip replacement, right      10/24/21 1446   10/24/21 1447  DME 3 n 1  Once        10/24/21 1446   10/24/21 1447  DME Bedside commode  Once       Question:  Patient needs a bedside commode to treat with the following condition  Answer:  Status post total hip replacement, right   10/24/21 1446            Diagnostic Studies: DG C-Arm 1-60 Min-No Report  Result Date: 10/24/2021 Fluoroscopy was utilized by the requesting physician.  No radiographic interpretation.   DG C-Arm 1-60 Min-No Report  Result Date: 10/24/2021 Fluoroscopy was utilized by the requesting physician.  No radiographic interpretation.   DG HIP UNILAT WITH PELVIS 1V RIGHT  Result Date: 10/24/2021 CLINICAL DATA:  Right hip replacement EXAM: DG HIP (WITH OR WITHOUT PELVIS) 2-3V RIGHT; DG HIP (WITH OR WITHOUT PELVIS) 1V RIGHT  COMPARISON:  None. FINDINGS: Interval right total hip arthroplasty. Normal alignment. No hardware failure or complication. No acute fracture or dislocation. Postsurgical changes in the soft tissues overlying the right hip. IMPRESSION: Interval right total hip arthroplasty without failure or complication. Electronically Signed   By: Kathreen Devoid M.D.   On: 10/24/2021 12:40   DG HIP UNILAT W OR W/O PELVIS 2-3 VIEWS  RIGHT  Result Date: 10/24/2021 CLINICAL DATA:  Right hip replacement EXAM: DG HIP (WITH OR WITHOUT PELVIS) 2-3V RIGHT; DG HIP (WITH OR WITHOUT PELVIS) 1V RIGHT COMPARISON:  None. FINDINGS: Interval right total hip arthroplasty. Normal alignment. No hardware failure or complication. No acute fracture or dislocation. Postsurgical changes in the soft tissues overlying the right hip. IMPRESSION: Interval right total hip arthroplasty without failure or complication. Electronically Signed   By: Kathreen Devoid M.D.   On: 10/24/2021 12:40    Disposition:      Contact information for follow-up providers     Duanne Guess, PA-C Follow up in 2 week(s).   Specialties: Orthopedic Surgery, Emergency Medicine Contact information: [place] [address] [phone]              Contact information for after-discharge care     [facility] .   Service: Skilled Nursing Contact information: [place] [phone]                      Signed: Feliberto Gottron 10/26/2021, 10:52 AM

## 2021-10-26 NOTE — Progress Notes (Signed)
° °  Subjective: 2 Days Post-Op Procedure(s) (LRB): TOTAL HIP ARTHROPLASTY ANTERIOR APPROACH (Right) Patient reports pain as mild.   Patient is well, and has had no acute complaints or problems Denies any CP, SOB, ABD pain. We will continue therapy today.  Plan is to go to SNF  Objective: Vital signs in last 24 hours: Temp:  [97.6 F (36.4 C)-98.1 F (36.7 C)] 98.1 F (36.7 C) (01/26 0809) Pulse Rate:  [80-98] 83 (01/26 0809) Resp:  [15-18] 16 (01/26 0809) BP: (79-119)/(56-67) 119/65 (01/26 1004) SpO2:  [95 %-99 %] 95 % (01/26 0809)  Intake/Output from previous day: 01/25 0701 - 01/26 0700 In: 500.1 [IV Piggyback:500.1] Out: 200 [Urine:200] Intake/Output this shift: Total I/O In: -  Out: 150 [Urine:150]  Recent Labs    10/24/21 1503  HGB 11.9*   Recent Labs    10/24/21 1503  WBC 6.0  RBC 4.00  HCT 35.2*  PLT 132*   Recent Labs    10/24/21 1503  CREATININE 0.76   No results for input(s): LABPT, INR in the last 72 hours.  EXAM General - Patient is Alert, Appropriate, and Oriented Extremity - Neurovascular intact Sensation intact distally Intact pulses distally Dorsiflexion/Plantar flexion intact Dressing - dressing C/D/I and no drainage, provena intact with out drainage Motor Function - intact, moving foot and toes well on exam.   Past Medical History:  Diagnosis Date   Arthritis    Dysrhythmia    GERD (gastroesophageal reflux disease)    Hyperlipidemia    Hypertension    Osteopenia of hip 2020   Bone spares   Overactive bladder    Vertigo    Vertigo     Assessment/Plan:   2 Days Post-Op Procedure(s) (LRB): TOTAL HIP ARTHROPLASTY ANTERIOR APPROACH (Right) Principal Problem:   Status post total hip replacement, right  Estimated body mass index is 30.31 kg/m as calculated from the following:   Height as of this encounter: 4\' 10"  (1.473 m).   Weight as of this encounter: 65.8 kg. Advance diet Up with therapy Work on BM Pain well  controlled VSS Labs stable CM to assist with discharge to SNF  DVT Prophylaxis - Lovenox, TED hose, and SCDS Weight-Bearing as tolerated to right leg   T. Rachelle Hora, PA-C [place] 10/26/2021, 10:48 AM

## 2021-10-26 NOTE — TOC Progression Note (Signed)
Transition of Care [facility]) - Progression Note    Patient Details  Name: Kaitlin Wilson MRN: [id number] Date of Birth: 15-May-1951  Transition of Care [facility]) CM/SW [place], RN Phone Number: 10/26/2021, 1:33 PM  Clinical Narrative:   In approved [id number], to go to [employer]         Expected Discharge Plan and Services                                                 Social Determinants of Health (SDOH) Interventions    Readmission Risk Interventions No flowsheet data found.

## 2021-10-26 NOTE — Progress Notes (Signed)
Occupational Therapy Treatment Patient Details Name: Kaitlin Wilson MRN: [id number] DOB: 18-Feb-1951 Today's Date: 10/26/2021   History of present illness Pt is 71 y.o. female s/p R THA with anterior approach.  Pt has PMH including: HTN, Afib, GERD, Arthritis, dysrhythmia, vertigo, and overactive bladder.   OT comments  Kaitlin Wilson was seen for OT treatment on this date. Upon arrival to room pt reclined in bed, sleeping lightly, awakes to voice and agreeable to tx. Pt requires CGA don/doff briefs sitting EOB - MOD cues for safety as pt stands without walker repeatedly. CGA + RW toilet t/f and grooming standing sinkside, tolerates ~5 min total prior to rest break. Pt requires MOD cues t/o all mobility/ADL taks 2/2 poor safety awareness. Pt making progress toward goals. Pt continues to benefit from skilled OT services to maximize return to PLOF and minimize risk of future falls, injury, caregiver burden, and readmission. Will continue to follow POC. Discharge recommendation updated to reflect increased level of supervision required.     Recommendations for follow up therapy are one component of a multi-disciplinary discharge planning process, led by the attending physician.  Recommendations may be updated based on patient status, additional functional criteria and insurance authorization.    Follow Up Recommendations  Home health OT (if unable to have 24/7 supervision pt will require STR stay)    Assistance Recommended at Discharge Frequent or constant Supervision/Assistance  Patient can return home with the following  A little help with walking and/or transfers;A little help with bathing/dressing/bathroom;Assistance with cooking/housework;Help with stairs or ramp for entrance   Equipment Recommendations  BSC/3in1    Recommendations for Other Services      Precautions / Restrictions Precautions Precautions: Anterior Hip;Fall Restrictions Weight Bearing Restrictions: Yes RLE Weight  Bearing: Weight bearing as tolerated       Mobility Bed Mobility Overal bed mobility: Needs Assistance Bed Mobility: Supine to Sit, Sit to Supine     Supine to sit: Min assist Sit to supine: Min assist   General bed mobility comments: MAX step by step cueing to use sheet as leg lifter    Transfers Overall transfer level: Needs assistance Equipment used: Rolling walker (2 wheels) Transfers: Sit to/from Stand Sit to Stand: Min guard           General transfer comment: cues for technique, pt frequnetly leaves walker     Balance Overall balance assessment: Needs assistance Sitting-balance support: No upper extremity supported, Feet unsupported Sitting balance-Leahy Scale: Fair     Standing balance support: No upper extremity supported, During functional activity Standing balance-Leahy Scale: Fair                             ADL either performed or assessed with clinical judgement   ADL Overall ADL's : Needs assistance/impaired                                       General ADL Comments: CGA don/doff briefs sitting EOB - MOD cues for safety as pt stands without walker repeatedly. CGA + RW toilet t/f and grooming standing sinkside, tolerates ~5 min total prior to rest break. Pt requires MOD cues t/o all mobility/ADL taks for safety awareness      Cognition Arousal/Alertness: Awake/alert Behavior During Therapy: WFL for tasks assessed/performed, Impulsive Overall Cognitive Status: No family/caregiver present to determine baseline cognitive functioning  General Comments: poor safety awareness and demonstrates poor carry over of instruction t/o sessin and from previous sessions                   Pertinent Vitals/ Pain       Pain Assessment Pain Assessment: 0-10 Pain Score: 6  Pain Location: R Anterior Hip Pain Descriptors / Indicators: Aching Pain Intervention(s): Limited activity within  patient's tolerance, Repositioned   Frequency  Min 2X/week        Progress Toward Goals  OT Goals(current goals can now be found in the care plan section)  Progress towards OT goals: Progressing toward goals  Acute Rehab OT Goals Patient Stated Goal: to feel better OT Goal Formulation: With patient/family Time For Goal Achievement: 11/08/21 Potential to Achieve Goals: Good ADL Goals Pt Will Perform Grooming: standing;Independently Pt Will Perform Lower Body Dressing: Independently;sit to/from stand Pt Will Transfer to Toilet: Independently;ambulating;regular height toilet  Plan Frequency remains appropriate;Discharge plan needs to be updated    Co-evaluation                 AM-PAC OT "6 Clicks" Daily Activity     Outcome Measure   Help from another person eating meals?: None Help from another person taking care of personal grooming?: A Little Help from another person toileting, which includes using toliet, bedpan, or urinal?: A Little Help from another person bathing (including washing, rinsing, drying)?: A Little Help from another person to put on and taking off regular upper body clothing?: None Help from another person to put on and taking off regular lower body clothing?: A Little 6 Click Score: 20    End of Session Equipment Utilized During Treatment: Rolling walker (2 wheels)  OT Visit Diagnosis: Unsteadiness on feet (R26.81)   Activity Tolerance Patient tolerated treatment well   Patient Left in bed;with call bell/phone within reach;with bed alarm set   Nurse Communication          Time: 2683-4196 OT Time Calculation (min): 26 min  Charges: OT General Charges $OT Visit: 1 Visit OT Treatments $Self Care/Home Management : 23-37 mins  Dessie Coma, M.S. OTR/L  10/26/21, 2:22 PM  ascom [phone]

## 2021-10-26 NOTE — TOC Progression Note (Signed)
Transition of Care [facility]) - Progression Note    Patient Details  Name: Kaitlin Wilson MRN: [id number] Date of Birth: 1950-10-09  Transition of Care [facility]) CM/SW [place], RN Phone Number: 10/26/2021, 8:22 AM  Clinical Narrative:    Submitted clinical notes for ins auth approval, ref number [id number]        Expected Discharge Plan and Services                                                 Social Determinants of Health (SDOH) Interventions    Readmission Risk Interventions No flowsheet data found.

## 2021-10-27 DIAGNOSIS — M1990 Unspecified osteoarthritis, unspecified site: Secondary | ICD-10-CM | POA: Diagnosis not present

## 2021-10-27 DIAGNOSIS — Z96641 Presence of right artificial hip joint: Secondary | ICD-10-CM | POA: Diagnosis not present

## 2021-10-27 DIAGNOSIS — K219 Gastro-esophageal reflux disease without esophagitis: Secondary | ICD-10-CM | POA: Diagnosis not present

## 2021-10-27 DIAGNOSIS — M159 Polyosteoarthritis, unspecified: Secondary | ICD-10-CM | POA: Diagnosis not present

## 2021-10-27 DIAGNOSIS — M8580 Other specified disorders of bone density and structure, unspecified site: Secondary | ICD-10-CM | POA: Diagnosis not present

## 2021-10-27 DIAGNOSIS — Z79899 Other long term (current) drug therapy: Secondary | ICD-10-CM | POA: Diagnosis not present

## 2021-10-27 DIAGNOSIS — E782 Mixed hyperlipidemia: Secondary | ICD-10-CM | POA: Diagnosis not present

## 2021-10-27 DIAGNOSIS — Z471 Aftercare following joint replacement surgery: Secondary | ICD-10-CM | POA: Diagnosis not present

## 2021-10-27 DIAGNOSIS — I10 Essential (primary) hypertension: Secondary | ICD-10-CM | POA: Diagnosis not present

## 2021-10-27 DIAGNOSIS — N3281 Overactive bladder: Secondary | ICD-10-CM | POA: Diagnosis not present

## 2021-10-27 DIAGNOSIS — Z23 Encounter for immunization: Secondary | ICD-10-CM | POA: Diagnosis not present

## 2021-10-27 DIAGNOSIS — M1611 Unilateral primary osteoarthritis, right hip: Secondary | ICD-10-CM | POA: Diagnosis not present

## 2021-10-27 DIAGNOSIS — R42 Dizziness and giddiness: Secondary | ICD-10-CM | POA: Diagnosis not present

## 2021-10-27 DIAGNOSIS — M7989 Other specified soft tissue disorders: Secondary | ICD-10-CM | POA: Diagnosis not present

## 2021-10-27 DIAGNOSIS — Z20822 Contact with and (suspected) exposure to covid-19: Secondary | ICD-10-CM | POA: Diagnosis not present

## 2021-10-27 MED ORDER — LOSARTAN POTASSIUM 25 MG PO TABS
12.5000 mg | ORAL_TABLET | Freq: Every day | ORAL | Status: DC
Start: 2021-10-28 — End: 2021-10-27

## 2021-10-27 NOTE — Progress Notes (Addendum)
Physical Therapy Treatment Patient Details Name: Kaitlin Wilson MRN: [id number] DOB: [birth year] Today's Date: 10/27/2021   History of Present Illness Pt is 71 y.o. female s/p R THA with anterior approach.  Pt has PMH including: HTN, Afib, GERD, Arthritis, dysrhythmia, vertigo, and overactive bladder.    PT Comments    Patient tolerated session well and was agreeable to treatment. Patient reports 6/10 pain and mild dizziness upon arrival into room. No dizziness reported with functional activity. Patient continues to require assistance either through [facility] or assistance at the ankles to complete supine to sitting. Patient did demonstrate sit>supine transfer at supervision. Sit to stands continue to require CGA-SBA for safety with RW. Patient also continues to require constant cueing on ER of R foot and to maintain a safe distance from the RW when ambulating. Patient would continue to benefit from skilled physical therapy in order to optimize return to PLOF. Continue to recommend STR following discharge from acute hospitalization.    Recommendations for follow up therapy are one component of a multi-disciplinary discharge planning process, led by the attending physician.  Recommendations may be updated based on patient status, additional functional criteria and insurance authorization.  Follow Up Recommendations  Skilled nursing-short term rehab (<3 hours/day)     Assistance Recommended at Discharge Frequent or constant Supervision/Assistance  Patient can return home with the following A little help with walking and/or transfers;A little help with bathing/dressing/bathroom;Assistance with cooking/housework;Assist for transportation;Help with stairs or ramp for entrance   Equipment Recommendations  Rolling walker (2 wheels);BSC/3in1    Recommendations for Other Services       Precautions / Restrictions Precautions Precautions: Anterior Hip;Fall Restrictions Weight Bearing Restrictions:  Yes RLE Weight Bearing: Weight bearing as tolerated     Mobility  Bed Mobility Overal bed mobility: Needs Assistance Bed Mobility: Supine to Sit, Sit to Supine     Supine to sit: Min assist (HHA to pull up to sitting) Sit to supine: Supervision (with increased time and effort)        Transfers Overall transfer level: Needs assistance Equipment used: Rolling walker (2 wheels) Transfers: Sit to/from Stand Sit to Stand: Min guard           General transfer comment: pt consistently allows walker to get away from her, even with verbal cues. Constantly cued to stay a safe distance from walker    Ambulation/Gait Ambulation/Gait assistance: Min guard Gait Distance (Feet): 200 Feet Assistive device: Rolling walker (2 wheels) Gait Pattern/deviations: WFL(Within Functional Limits) Gait velocity: decreased     General Gait Details: constant cueing to prevent ER of the foot on the R and to maintain a safe distance inside the walker   Stairs             Wheelchair Mobility    Modified Rankin (Stroke Patients Only)       Balance Overall balance assessment: Needs assistance Sitting-balance support: No upper extremity supported, Feet unsupported Sitting balance-Leahy Scale: Good     Standing balance support: No upper extremity supported, During functional activity Standing balance-Leahy Scale: Fair Standing balance comment: relies on RW for balance                            Cognition Arousal/Alertness: Awake/alert Behavior During Therapy: WFL for tasks assessed/performed, Impulsive Overall Cognitive Status: Within Functional Limits for tasks assessed  General Comments: A&Ox3 (location, situation, name/DOB)        Exercises     General Comments        Pertinent Vitals/Pain Pain Assessment Pain Assessment: 0-10 Pain Score: 6  Faces Pain Scale: Hurts little more Pain Location: R Anterior  Hip Pain Descriptors / Indicators: Aching Pain Intervention(s): Limited activity within patient's tolerance, Monitored during session, Repositioned    Home Living                          Prior Function            PT Goals (current goals can now be found in the care plan section) Acute Rehab PT Goals Patient Stated Goal: to get stronger and better. PT Goal Formulation: With patient/family Time For Goal Achievement: 11/07/21 Potential to Achieve Goals: Good Progress towards PT goals: Progressing toward goals    Frequency    BID      PT Plan Current plan remains appropriate    Co-evaluation              AM-PAC PT "6 Clicks" Mobility   Outcome Measure  Help needed turning from your back to your side while in a flat bed without using bedrails?: A Little Help needed moving from lying on your back to sitting on the side of a flat bed without using bedrails?: A Little Help needed moving to and from a bed to a chair (including a wheelchair)?: A Little Help needed standing up from a chair using your arms (e.g., wheelchair or bedside chair)?: A Little Help needed to walk in hospital room?: A Little Help needed climbing 3-5 steps with a railing? : A Lot 6 Click Score: 17    End of Session Equipment Utilized During Treatment: Gait belt Activity Tolerance: Patient tolerated treatment well;Treatment limited secondary to medical complications (Comment) Patient left: with call bell/phone within reach;in bed;with bed alarm set Nurse Communication: Mobility status PT Visit Diagnosis: Unsteadiness on feet (R26.81);Other abnormalities of gait and mobility (R26.89);Muscle weakness (generalized) (M62.81);Difficulty in walking, not elsewhere classified (R26.2);Pain Pain - Right/Left: Right Pain - part of body: Hip     Time: 4540-9811 PT Time Calculation (min) (ACUTE ONLY): 16 min  Charges:  $Gait Training: 8-22 mins                    Iva Boop, PT   10/27/21. 1:16 PM

## 2021-10-27 NOTE — Progress Notes (Signed)
Physical Therapy Treatment Patient Details Name: Kaitlin Wilson MRN: [id number] DOB: 1951-08-29 Today's Date: 10/27/2021   History of Present Illness Pt is 71 y.o. female s/p R THA with anterior approach.  Pt has PMH including: HTN, Afib, GERD, Arthritis, dysrhythmia, vertigo, and overactive bladder.    PT Comments    Patient tolerated session well and was agreeable to treatment. Patient reported 7/10 pain at beginning and conclusion of session. Patient left with RN for paid medication administration at conclusion of session. Vitals stable and WFL throughout treatment session. Upon arrival patient was supine with HOB elevated resting eager for physical therapy. Patient demonstrated Min A for supine<>sit bed mobility with assistance at the ankles due to RLE pain and weakness. Sit to stand transfers were completed CGA with cueing for hand placement on RW. Patient was able to ambulate slightly longer distance compared to last session despite reports of fatigue.  Patient would continue to benefit from skilled physical therapy in order to optimize return to PLOF. Continue to recommend STR following discharge from acute hospitalization.    Recommendations for follow up therapy are one component of a multi-disciplinary discharge planning process, led by the attending physician.  Recommendations may be updated based on patient status, additional functional criteria and insurance authorization.  Follow Up Recommendations  Skilled nursing-short term rehab (<3 hours/day)     Assistance Recommended at Discharge Frequent or constant Supervision/Assistance  Patient can return home with the following A little help with walking and/or transfers;A little help with bathing/dressing/bathroom;Assistance with cooking/housework;Assist for transportation;Help with stairs or ramp for entrance   Equipment Recommendations  Rolling walker (2 wheels);BSC/3in1    Recommendations for Other Services        Precautions / Restrictions Precautions Precautions: Anterior Hip;Fall Restrictions Weight Bearing Restrictions: Yes RLE Weight Bearing: Weight bearing as tolerated     Mobility  Bed Mobility Overal bed mobility: Needs Assistance Bed Mobility: Supine to Sit, Sit to Supine     Supine to sit: Min assist (at the RLE to maintain hip precautions) Sit to supine: Min assist (at the ankles)        Transfers Overall transfer level: Needs assistance Equipment used: Rolling walker (2 wheels) Transfers: Sit to/from Stand Sit to Stand: Min guard           General transfer comment: pt consistently allows walker to get away from her, even with verbal cues. Constantly cued to stay a safe distance from walker    Ambulation/Gait Ambulation/Gait assistance: Min guard Gait Distance (Feet): 205 Feet Assistive device: Rolling walker (2 wheels) Gait Pattern/deviations: WFL(Within Functional Limits) Gait velocity: decreased     General Gait Details: patient reported fatigue however was able to make it all the way around the nurses station; constant cueing to prevent ER of the foot on the R   Stairs             Wheelchair Mobility    Modified Rankin (Stroke Patients Only)       Balance Overall balance assessment: Needs assistance Sitting-balance support: No upper extremity supported, Feet unsupported       Standing balance support: No upper extremity supported, During functional activity Standing balance-Leahy Scale: Fair                              Cognition Arousal/Alertness: Awake/alert Behavior During Therapy: WFL for tasks assessed/performed, Impulsive Overall Cognitive Status: Within Functional Limits for tasks assessed  General Comments: A&Ox3 (location, situation, name/DOB)        Exercises General Exercises - Lower Extremity Ankle Circles/Pumps: Supine, AROM, 20 reps, Both Quad Sets: Supine, 20  reps, AROM, Both, Strengthening Gluteal Sets: Supine, 15 reps, AROM, Both, Strengthening (w/ a 3 second hold) Hip Flexion/Marching: Seated, 10 reps, AROM, AAROM, Both, Strengthening Other Exercises Other Exercises: sit to stand from elevated toliet seat, patient demonstrated good balance during clean up and hand hygiene with RW in-front of patient; completed Mod I    General Comments        Pertinent Vitals/Pain Pain Assessment Pain Assessment: 0-10 Pain Score: 7  Faces Pain Scale: Hurts little more Pain Location: R Anterior Hip Pain Descriptors / Indicators: Aching Pain Intervention(s): Limited activity within patient's tolerance, Monitored during session, Repositioned    Home Living                          Prior Function            PT Goals (current goals can now be found in the care plan section) Acute Rehab PT Goals Patient Stated Goal: to get stronger and better. PT Goal Formulation: With patient/family Time For Goal Achievement: 11/07/21 Potential to Achieve Goals: Good Progress towards PT goals: Progressing toward goals    Frequency    BID      PT Plan Current plan remains appropriate    Co-evaluation              AM-PAC PT "6 Clicks" Mobility   Outcome Measure  Help needed turning from your back to your side while in a flat bed without using bedrails?: A Little Help needed moving from lying on your back to sitting on the side of a flat bed without using bedrails?: A Little Help needed moving to and from a bed to a chair (including a wheelchair)?: A Little Help needed standing up from a chair using your arms (e.g., wheelchair or bedside chair)?: A Little Help needed to walk in hospital room?: A Little Help needed climbing 3-5 steps with a railing? : A Lot 6 Click Score: 17    End of Session Equipment Utilized During Treatment: Gait belt Activity Tolerance: Patient tolerated treatment well;Treatment limited secondary to medical  complications (Comment) Patient left: with call bell/phone within reach;in bed;with bed alarm set Nurse Communication: Mobility status PT Visit Diagnosis: Unsteadiness on feet (R26.81);Other abnormalities of gait and mobility (R26.89);Muscle weakness (generalized) (M62.81);Difficulty in walking, not elsewhere classified (R26.2);Pain Pain - Right/Left: Right Pain - part of body: Hip     Time: 2409-7353 PT Time Calculation (min) (ACUTE ONLY): 25 min  Charges:  $Gait Training: 8-22 mins $Therapeutic Exercise: 8-22 mins                     Iva Boop, PT  10/27/21. 10:25 AM

## 2021-10-27 NOTE — Progress Notes (Signed)
° °  Subjective: 3 Days Post-Op Procedure(s) (LRB): TOTAL HIP ARTHROPLASTY ANTERIOR APPROACH (Right) Patient reports pain as mild.   Patient is well, and has had no acute complaints or problems Denies any CP, SOB, ABD pain. We will continue therapy today.  Plan is to go to SNF, patient with no help at home  Objective: Vital signs in last 24 hours: Temp:  [97.9 F (36.6 C)-98.4 F (36.9 C)] 98.4 F (36.9 C) (01/27 0419) Pulse Rate:  [85-96] 85 (01/27 0419) Resp:  [16-17] 16 (01/27 0419) BP: (106-120)/(59-71) 106/59 (01/27 0419) SpO2:  [95 %-98 %] 96 % (01/27 0419)  Intake/Output from previous day: 01/26 0701 - 01/27 0700 In: -  Out: 150 [Urine:150] Intake/Output this shift: No intake/output data recorded.  Recent Labs    10/24/21 1503  HGB 11.9*   Recent Labs    10/24/21 1503  WBC 6.0  RBC 4.00  HCT 35.2*  PLT 132*   Recent Labs    10/24/21 1503  CREATININE 0.76   No results for input(s): LABPT, INR in the last 72 hours.  EXAM General - Patient is Alert, Appropriate, and Oriented Extremity - Neurovascular intact Sensation intact distally Intact pulses distally Dorsiflexion/Plantar flexion intact Dressing - dressing C/D/I and no drainage, provena intact with out drainage Motor Function - intact, moving foot and toes well on exam.   Past Medical History:  Diagnosis Date   Arthritis    Dysrhythmia    GERD (gastroesophageal reflux disease)    Hyperlipidemia    Hypertension    Osteopenia of hip 2020   Bone spares   Overactive bladder    Vertigo    Vertigo     Assessment/Plan:   3 Days Post-Op Procedure(s) (LRB): TOTAL HIP ARTHROPLASTY ANTERIOR APPROACH (Right) Principal Problem:   Status post total hip replacement, right  Estimated body mass index is 30.31 kg/m as calculated from the following:   Height as of this encounter: 4\' 10"  (1.473 m).   Weight as of this encounter: 65.8 kg. Advance diet Up with therapy Work on BM Pain well  controlled VSS, BP Soft. Hold losartan. Continue to monitor Labs stable CM to assist with discharge to SNF pending insurance authorization  DVT Prophylaxis - Lovenox, TED hose, and SCDS Weight-Bearing as tolerated to right leg   T. Rachelle Hora, PA-C [place] 10/27/2021, 8:10 AM

## 2021-10-27 NOTE — TOC Progression Note (Signed)
Transition of Care [facility]) - Progression Note    Patient Details  Name: Kaitlin Wilson MRN: [id number] Date of Birth: September 24, 1951  Transition of Care [facility]) CM/SW [place], RN Phone Number: 10/27/2021, 10:10 AM  Clinical Narrative:    Her daughter is to transport her to [facility] today room 403        Expected Discharge Plan and Services                                                 Social Determinants of Health ([place]) Interventions    Readmission Risk Interventions No flowsheet data found.

## 2021-10-27 NOTE — Plan of Care (Signed)
PLAN OF CARE ONGOING Problem: Education: Goal: Knowledge of General Education information will improve Description: Including pain rating scale, medication(s)/side effects and non-pharmacologic comfort measures Outcome: Progressing   Problem: Health Behavior/Discharge Planning: Goal: Ability to manage health-related needs will improve Outcome: Progressing   Problem: Clinical Measurements: Goal: Ability to maintain clinical measurements within normal limits will improve Outcome: Progressing Goal: Will remain free from infection Outcome: Progressing Goal: Diagnostic test results will improve Outcome: Progressing Goal: Respiratory complications will improve Outcome: Progressing Goal: Cardiovascular complication will be avoided Outcome: Progressing   Problem: Activity: Goal: Risk for activity intolerance will decrease Outcome: Progressing   Problem: Nutrition: Goal: Adequate nutrition will be maintained Outcome: Progressing   Problem: Coping: Goal: Level of anxiety will decrease Outcome: Progressing   Problem: Elimination: Goal: Will not experience complications related to bowel motility Outcome: Progressing Goal: Will not experience complications related to urinary retention Outcome: Progressing   Problem: Pain Managment: Goal: General experience of comfort will improve Outcome: Progressing   Problem: Safety: Goal: Ability to remain free from injury will improve Outcome: Progressing   Problem: Skin Integrity: Goal: Risk for impaired skin integrity will decrease Outcome: Progressing   Problem: Education: Goal: Knowledge of the prescribed therapeutic regimen will improve Outcome: Progressing Goal: Understanding of discharge needs will improve Outcome: Progressing Goal: Individualized Educational Video(s) Outcome: Progressing   Problem: Activity: Goal: Ability to avoid complications of mobility impairment will improve Outcome: Progressing Goal: Ability to  tolerate increased activity will improve Outcome: Progressing   Problem: Clinical Measurements: Goal: Postoperative complications will be avoided or minimized Outcome: Progressing   Problem: Pain Management: Goal: Pain level will decrease with appropriate interventions Outcome: Progressing   Problem: Skin Integrity: Goal: Will show signs of wound healing Outcome: Progressing

## 2021-11-08 ENCOUNTER — Other Ambulatory Visit: Payer: Self-pay | Admitting: Orthopedic Surgery

## 2021-11-08 ENCOUNTER — Ambulatory Visit
Admission: RE | Admit: 2021-11-08 | Discharge: 2021-11-08 | Disposition: A | Discharge status: Home / Self Care | Payer: Medicare HMO | Source: Ambulatory Visit | Attending: Orthopedic Surgery | Admitting: Orthopedic Surgery

## 2021-11-08 ENCOUNTER — Ambulatory Visit: Payer: Medicare HMO

## 2021-11-08 DIAGNOSIS — Z96641 Presence of right artificial hip joint: Secondary | ICD-10-CM

## 2021-11-08 DIAGNOSIS — M7989 Other specified soft tissue disorders: Secondary | ICD-10-CM | POA: Diagnosis not present

## 2021-11-13 ENCOUNTER — Inpatient Hospital Stay: Payer: Medicare HMO

## 2021-11-13 ENCOUNTER — Inpatient Hospital Stay: Payer: Medicare HMO | Admitting: Oncology

## 2021-12-06 DIAGNOSIS — Z96641 Presence of right artificial hip joint: Secondary | ICD-10-CM | POA: Diagnosis not present

## 2021-12-21 DIAGNOSIS — R300 Dysuria: Secondary | ICD-10-CM | POA: Diagnosis not present

## 2021-12-21 DIAGNOSIS — B3731 Acute candidiasis of vulva and vagina: Secondary | ICD-10-CM | POA: Diagnosis not present

## 2021-12-21 DIAGNOSIS — R399 Unspecified symptoms and signs involving the genitourinary system: Secondary | ICD-10-CM | POA: Diagnosis not present

## 2022-01-09 DIAGNOSIS — R197 Diarrhea, unspecified: Secondary | ICD-10-CM | POA: Diagnosis not present

## 2022-01-22 DIAGNOSIS — R197 Diarrhea, unspecified: Secondary | ICD-10-CM | POA: Diagnosis not present

## 2022-01-22 DIAGNOSIS — K6389 Other specified diseases of intestine: Secondary | ICD-10-CM | POA: Diagnosis not present

## 2022-01-23 DIAGNOSIS — R197 Diarrhea, unspecified: Secondary | ICD-10-CM | POA: Diagnosis not present

## 2022-01-31 ENCOUNTER — Inpatient Hospital Stay: Payer: Medicare HMO

## 2022-01-31 ENCOUNTER — Encounter: Payer: Self-pay | Admitting: Oncology

## 2022-01-31 ENCOUNTER — Inpatient Hospital Stay: Payer: Medicare HMO | Attending: Oncology | Admitting: Oncology

## 2022-01-31 ENCOUNTER — Encounter (INDEPENDENT_AMBULATORY_CARE_PROVIDER_SITE_OTHER): Payer: Self-pay

## 2022-01-31 VITALS — BP 133/68 | HR 68 | Temp 97.7°F | Resp 16 | Wt 142.8 lb

## 2022-01-31 DIAGNOSIS — E538 Deficiency of other specified B group vitamins: Secondary | ICD-10-CM | POA: Diagnosis not present

## 2022-01-31 DIAGNOSIS — D708 Other neutropenia: Secondary | ICD-10-CM | POA: Diagnosis not present

## 2022-01-31 DIAGNOSIS — Z79899 Other long term (current) drug therapy: Secondary | ICD-10-CM | POA: Diagnosis not present

## 2022-01-31 DIAGNOSIS — R5383 Other fatigue: Secondary | ICD-10-CM | POA: Insufficient documentation

## 2022-01-31 LAB — CBC WITH DIFFERENTIAL/PLATELET
Abs Immature Granulocytes: 0.01 10*3/uL (ref 0.00–0.07)
Basophils Absolute: 0 10*3/uL (ref 0.0–0.1)
Basophils Relative: 0 %
Eosinophils Absolute: 0 10*3/uL (ref 0.0–0.5)
Eosinophils Relative: 1 %
HCT: 37.7 % (ref 36.0–46.0)
Hemoglobin: 12.1 g/dL (ref 12.0–15.0)
Immature Granulocytes: 0 %
Lymphocytes Relative: 22 %
Lymphs Abs: 0.7 10*3/uL (ref 0.7–4.0)
MCH: 27.3 pg (ref 26.0–34.0)
MCHC: 32.1 g/dL (ref 30.0–36.0)
MCV: 84.9 fL (ref 80.0–100.0)
Monocytes Absolute: 0.2 10*3/uL (ref 0.1–1.0)
Monocytes Relative: 5 %
Neutro Abs: 2.1 10*3/uL (ref 1.7–7.7)
Neutrophils Relative %: 72 %
Platelets: 131 10*3/uL — ABNORMAL LOW (ref 150–400)
RBC: 4.44 MIL/uL (ref 3.87–5.11)
RDW: 13.2 % (ref 11.5–15.5)
WBC: 3 10*3/uL — ABNORMAL LOW (ref 4.0–10.5)
nRBC: 0 % (ref 0.0–0.2)

## 2022-01-31 LAB — VITAMIN B12: Vitamin B-12: 1134 pg/mL — ABNORMAL HIGH (ref 180–914)

## 2022-01-31 NOTE — Progress Notes (Signed)
? ? ? ?Hematology/Oncology Consult note ?[place]  ?[phone] [phone] ? ?Patient Care Team: ?Sofie Hartigan, MD as PCP - General (Family Medicine)  ? ?Name of the patient: Kaitlin Wilson  ?[id number]  ?01/11/1951  ? ?Date of visit: 01/31/22 ? ?Diagnosis-mild leukopenia ? ?Chief complaint/ Reason for visit-routine follow-up of leukopenia ? ?Heme/Onc history: patient is a 71 year old female with a past medical history significant for hypertension hyperlipidemia GERD and arthritis referred for leukopenia.  Most recent CBC from 05/19/2021 showed a white count of 2.2, H&H of 12.5/36.3 with a platelet count of 146.  Differential mainly showed neutropenia with an ANC of 1.3And lymphopenia.  2 years ago in November 2020 patient had a normal white count of 5.1 with an ANC of 3.8.  In June 2022 her white count was 2.8 with an ANC of 1.9.  Patient reports feeling well overall.  Denies any unintentional weight loss or drenching night sweats.  Denies any recent illnesses.  Reports ongoing low back pain.  She also had symptoms of dyspepsia and underwent EGD recently ?  ? ?Interval history-patient reports some ongoing diarrhea.  She was recently given antibiotics as well.  Otherwise doing well and denies any other complaints at this time ? ?ECOG PS- 0 ?Pain scale- 0 ? ? ?Review of systems- Review of Systems  ?Constitutional:  Positive for malaise/fatigue. Negative for chills, fever and weight loss.  ?HENT:  Negative for congestion, ear discharge and nosebleeds.   ?Eyes:  Negative for blurred vision.  ?Respiratory:  Negative for cough, hemoptysis, sputum production, shortness of breath and wheezing.   ?Cardiovascular:  Negative for chest pain, palpitations, orthopnea and claudication.  ?Gastrointestinal:  Positive for diarrhea. Negative for abdominal pain, blood in stool, constipation, heartburn, melena, nausea and vomiting.  ?Genitourinary:  Negative for dysuria, flank pain,  frequency, hematuria and urgency.  ?Musculoskeletal:  Negative for back pain, joint pain and myalgias.  ?Skin:  Negative for rash.  ?Neurological:  Negative for dizziness, tingling, focal weakness, seizures, weakness and headaches.  ?Endo/Heme/Allergies:  Does not bruise/bleed easily.  ?Psychiatric/Behavioral:  Negative for depression and suicidal ideas. The patient does not have insomnia.    ? ? ? ?Allergies  ?Allergen Reactions  ? Rosuvastatin Nausea Only  ? ? ? ?Past Medical History:  ?Diagnosis Date  ? Arthritis   ? Dysrhythmia   ? GERD (gastroesophageal reflux disease)   ? Hyperlipidemia   ? Hypertension   ? Osteopenia of hip 2020  ? Bone spares  ? Overactive bladder   ? Vertigo   ? Vertigo   ? ? ? ?Past Surgical History:  ?Procedure Laterality Date  ? ESOPHAGOGASTRODUODENOSCOPY (EGD) WITH PROPOFOL N/A 06/16/2021  ? Procedure: ESOPHAGOGASTRODUODENOSCOPY (EGD) WITH PROPOFOL;  Surgeon: Lesly Rubenstein, MD;  Location: [facility] ENDOSCOPY;  Service: Endoscopy;  Laterality: N/A;  ? TOTAL HIP ARTHROPLASTY Right 10/24/2021  ? Procedure: TOTAL HIP ARTHROPLASTY ANTERIOR APPROACH;  Surgeon: Hessie Knows, MD;  Location: [facility] ORS;  Service: Orthopedics;  Laterality: Right;  ? ? ?Social History  ? ?Socioeconomic History  ? Marital status: Single  ?  Spouse name: Not on file  ? Number of children: Not on file  ? Years of education: Not on file  ? Highest education level: Not on file  ?Occupational History  ? Not on file  ?Tobacco Use  ? Smoking status: Never  ? Smokeless tobacco: Never  ?Vaping Use  ? Vaping Use: Never used  ?Substance and Sexual Activity  ? Alcohol use: No  ?  Drug use: No  ? Sexual activity: Not on file  ?Other Topics Concern  ? Not on file  ?Social History Narrative  ? Not on file  ? ?Social Determinants of Health  ? ?Financial Resource Strain: Not on file  ?Food Insecurity: Not on file  ?Transportation Needs: Not on file  ?Physical Activity: Not on file  ?Stress: Not on file  ?Social Connections: Not on  file  ?Intimate Partner Violence: Not on file  ? ? ?Family History  ?Problem Relation Age of Onset  ? Brain cancer Mother   ? Hypertension Father   ? Stroke Father   ? Kidney cancer Neg Hx   ? Bladder Cancer Neg Hx   ? ? ? ?Current Outpatient Medications:  ?  Cholecalciferol (VITAMIN D) 2000 units tablet, Take 2,000 Units by mouth daily., Disp: , Rfl:  ?  diphenoxylate-atropine (LOMOTIL) 2.5-0.025 MG tablet, Take by mouth., Disp: , Rfl:  ?  lansoprazole (PREVACID) 30 MG capsule, Take 30 mg by mouth daily., Disp: , Rfl:  ?  losartan (COZAAR) 25 MG tablet, Take 12.5 mg by mouth daily., Disp: , Rfl:  ?  predniSONE (DELTASONE) 10 MG tablet, Take by mouth., Disp: , Rfl:  ?  trolamine salicylate (ASPERCREME) 10 % cream, Apply 1 application topically as needed for muscle pain., Disp: , Rfl:  ?  vitamin B-12 (CYANOCOBALAMIN) 1000 MCG tablet, Take 1,000 mcg by mouth daily., Disp: , Rfl:  ?  aluminum hydroxide-magnesium carbonate (GAVISCON) 95-358 MG/15ML SUSP, Take 15 mLs by mouth as needed for indigestion. (Patient not taking: Reported on 01/31/2022), Disp: , Rfl:  ?  docusate sodium (COLACE) 100 MG capsule, Take 1 capsule (100 mg total) by mouth 2 (two) times daily. (Patient not taking: Reported on 01/31/2022), Disp: 10 capsule, Rfl: 0 ?  enoxaparin (LOVENOX) 40 MG/0.4ML injection, Inject 0.4 mLs (40 mg total) into the skin daily for 14 days. (Patient not taking: Reported on 01/31/2022), Disp: 5.6 mL, Rfl: 0 ?  HYDROcodone-acetaminophen (NORCO/VICODIN) 5-325 MG tablet, Take 1-2 tablets by mouth every 4 (four) hours as needed for moderate pain (pain score 4-6). (Patient not taking: Reported on 01/31/2022), Disp: 30 tablet, Rfl: 0 ?  methocarbamol (ROBAXIN) 500 MG tablet, Take 1 tablet (500 mg total) by mouth every 6 (six) hours as needed for muscle spasms. (Patient not taking: Reported on 01/31/2022), Disp: 30 tablet, Rfl: 0 ?  potassium chloride (KLOR-CON) 10 MEQ tablet, Take 10 mEq by mouth 2 (two) times daily. (Patient not taking:  Reported on 01/31/2022), Disp: , Rfl:  ?  traMADol (ULTRAM) 50 MG tablet, Take 1 tablet (50 mg total) by mouth every 6 (six) hours as needed. (Patient not taking: Reported on 01/31/2022), Disp: 30 tablet, Rfl: 0 ? ?Current Facility-Administered Medications:  ?  betamethasone acetate-betamethasone sodium phosphate (CELESTONE) injection 3 mg, 3 mg, Intramuscular, Once, Edrick Kins, DPM ? ?Physical exam:  ?Vitals:  ? 01/31/22 1030  ?BP: 133/68  ?Pulse: 68  ?Resp: 16  ?Temp: 97.7 ?F (36.5 ?C)  ?SpO2: 99%  ?Weight: 142 lb 12.8 oz (64.8 kg)  ? ?Physical Exam ?Constitutional:   ?   General: She is not in acute distress. ?Cardiovascular:  ?   Rate and Rhythm: Normal rate and regular rhythm.  ?   Heart sounds: Normal heart sounds.  ?Pulmonary:  ?   Effort: Pulmonary effort is normal.  ?   Breath sounds: Normal breath sounds.  ?Skin: ?   General: Skin is warm and dry.  ?Neurological:  ?  Mental Status: She is alert and oriented to person, place, and time.  ?  ? ? ?  Latest Ref Rng & Units 10/24/2021  ?  3:03 PM  ?CMP  ?Creatinine 0.44 - 1.00 mg/dL 0.76    ? ? ?  Latest Ref Rng & Units 01/31/2022  ?  9:39 AM  ?CBC  ?WBC 4.0 - 10.5 K/uL 3.0    ?Hemoglobin 12.0 - 15.0 g/dL 12.1    ?Hematocrit 36.0 - 46.0 % 37.7    ?Platelets 150 - 400 K/uL 131    ? ? ? ?Assessment and plan- Patient is a 71 y.o. female here for routine follow-up of leukopenia ? ?Patient has a mild leukopenia with a white count of 3 which is fluctuating and goes up and down.  Hemoglobin is normal at 12.  Platelet counts have remained mildly low in the 130s since December 2022.  Overall there is no clear decreasing trend in her white count and platelet count both of which are mild.  She therefore does not require a bone marrow biopsy at this time.  B12 levels were elevated at 1000 134 after starting B12 supplements.  I will see her back in 6 months with repeat CBC with diff ?  ?Visit Diagnosis ?1. Other fatigue   ?2. Other neutropenia ([place])   ?3. B12 deficiency    ? ? ? ?Dr. Randa Evens, MD, MPH ?[facility] ?[id number] ?01/31/2022 ?4:03 PM ? ? ? ? ? ? ?    ? ? ? ? ? ?

## 2022-01-31 NOTE — Progress Notes (Signed)
Pt states she has been having GI issues going on two months since she had her last course of antibiotics; was diagnosed with gastroenteritis.  ?

## 2022-02-05 DIAGNOSIS — D2371 Other benign neoplasm of skin of right lower limb, including hip: Secondary | ICD-10-CM | POA: Diagnosis not present

## 2022-02-21 ENCOUNTER — Other Ambulatory Visit: Payer: Self-pay | Admitting: Gastroenterology

## 2022-02-21 DIAGNOSIS — R1084 Generalized abdominal pain: Secondary | ICD-10-CM | POA: Diagnosis not present

## 2022-02-21 DIAGNOSIS — R194 Change in bowel habit: Secondary | ICD-10-CM

## 2022-03-01 ENCOUNTER — Ambulatory Visit
Admission: RE | Admit: 2022-03-01 | Discharge: 2022-03-01 | Disposition: A | Discharge status: Home / Self Care | Payer: Medicare HMO | Source: Ambulatory Visit | Attending: Gastroenterology | Admitting: Gastroenterology

## 2022-03-01 DIAGNOSIS — R161 Splenomegaly, not elsewhere classified: Secondary | ICD-10-CM | POA: Diagnosis not present

## 2022-03-01 DIAGNOSIS — R1084 Generalized abdominal pain: Secondary | ICD-10-CM | POA: Diagnosis not present

## 2022-03-01 DIAGNOSIS — R194 Change in bowel habit: Secondary | ICD-10-CM | POA: Insufficient documentation

## 2022-03-01 DIAGNOSIS — I700 Atherosclerosis of aorta: Secondary | ICD-10-CM | POA: Diagnosis not present

## 2022-03-01 LAB — POCT I-STAT CREATININE: Creatinine, Ser: 0.8 mg/dL (ref 0.44–1.00)

## 2022-03-01 MED ORDER — IOHEXOL 300 MG/ML  SOLN
100.0000 mL | Freq: Once | INTRAMUSCULAR | Status: AC | PRN
  Administered 2022-03-01: 100 mL via INTRAVENOUS

## 2022-03-05 DIAGNOSIS — Z4689 Encounter for fitting and adjustment of other specified devices: Secondary | ICD-10-CM | POA: Diagnosis not present

## 2022-03-12 ENCOUNTER — Other Ambulatory Visit: Payer: Self-pay | Admitting: *Deleted

## 2022-03-12 DIAGNOSIS — R161 Splenomegaly, not elsewhere classified: Secondary | ICD-10-CM

## 2022-03-14 ENCOUNTER — Telehealth: Payer: Self-pay | Admitting: *Deleted

## 2022-03-14 NOTE — Telephone Encounter (Signed)
Got a referral to have pt be seen  sooner than later. Dr. 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 I will see her in November as planned but she will need usg abdomen prior to her with em to see how her spleen is doing at that time . Orders put in and [place] called the pt. And added on the u/s of spleen before her visit with rao in Nov.

## 2022-06-06 DIAGNOSIS — I10 Essential (primary) hypertension: Secondary | ICD-10-CM | POA: Diagnosis not present

## 2022-06-06 DIAGNOSIS — I700 Atherosclerosis of aorta: Secondary | ICD-10-CM | POA: Diagnosis not present

## 2022-06-06 DIAGNOSIS — E7800 Pure hypercholesterolemia, unspecified: Secondary | ICD-10-CM | POA: Diagnosis not present

## 2022-06-06 DIAGNOSIS — K219 Gastro-esophageal reflux disease without esophagitis: Secondary | ICD-10-CM | POA: Diagnosis not present

## 2022-06-06 DIAGNOSIS — N898 Other specified noninflammatory disorders of vagina: Secondary | ICD-10-CM | POA: Diagnosis not present

## 2022-06-06 DIAGNOSIS — R300 Dysuria: Secondary | ICD-10-CM | POA: Diagnosis not present

## 2022-06-06 DIAGNOSIS — Z0000 Encounter for general adult medical examination without abnormal findings: Secondary | ICD-10-CM | POA: Diagnosis not present

## 2022-06-06 DIAGNOSIS — Z4689 Encounter for fitting and adjustment of other specified devices: Secondary | ICD-10-CM | POA: Diagnosis not present

## 2022-06-06 DIAGNOSIS — N3281 Overactive bladder: Secondary | ICD-10-CM | POA: Diagnosis not present

## 2022-06-06 DIAGNOSIS — M159 Polyosteoarthritis, unspecified: Secondary | ICD-10-CM | POA: Diagnosis not present

## 2022-06-06 DIAGNOSIS — I480 Paroxysmal atrial fibrillation: Secondary | ICD-10-CM | POA: Diagnosis not present

## 2022-06-06 DIAGNOSIS — R7302 Impaired glucose tolerance (oral): Secondary | ICD-10-CM | POA: Diagnosis not present

## 2022-06-06 DIAGNOSIS — Z1389 Encounter for screening for other disorder: Secondary | ICD-10-CM | POA: Diagnosis not present

## 2022-06-10 DIAGNOSIS — S20212A Contusion of left front wall of thorax, initial encounter: Secondary | ICD-10-CM | POA: Diagnosis not present

## 2022-06-13 DIAGNOSIS — S20212A Contusion of left front wall of thorax, initial encounter: Secondary | ICD-10-CM | POA: Diagnosis not present

## 2022-06-13 DIAGNOSIS — R0781 Pleurodynia: Secondary | ICD-10-CM | POA: Diagnosis not present

## 2022-06-28 DIAGNOSIS — Z96641 Presence of right artificial hip joint: Secondary | ICD-10-CM | POA: Diagnosis not present

## 2022-06-28 DIAGNOSIS — M1611 Unilateral primary osteoarthritis, right hip: Secondary | ICD-10-CM | POA: Diagnosis not present

## 2022-08-01 ENCOUNTER — Ambulatory Visit
Admission: RE | Admit: 2022-08-01 | Discharge: 2022-08-01 | Disposition: A | Discharge status: Home / Self Care | Payer: Medicare HMO | Source: Ambulatory Visit | Attending: Oncology | Admitting: Oncology

## 2022-08-01 DIAGNOSIS — R161 Splenomegaly, not elsewhere classified: Secondary | ICD-10-CM | POA: Diagnosis not present

## 2022-08-05 ENCOUNTER — Other Ambulatory Visit: Payer: Self-pay | Admitting: *Deleted

## 2022-08-05 DIAGNOSIS — D708 Other neutropenia: Secondary | ICD-10-CM

## 2022-08-05 DIAGNOSIS — E538 Deficiency of other specified B group vitamins: Secondary | ICD-10-CM

## 2022-08-06 ENCOUNTER — Inpatient Hospital Stay: Payer: Medicare HMO | Attending: Oncology | Admitting: Oncology

## 2022-08-06 ENCOUNTER — Encounter: Payer: Self-pay | Admitting: Oncology

## 2022-08-06 ENCOUNTER — Inpatient Hospital Stay: Payer: Medicare HMO

## 2022-08-06 VITALS — BP 151/70 | HR 56 | Temp 98.1°F | Resp 16 | Ht <= 58 in | Wt 149.3 lb

## 2022-08-06 DIAGNOSIS — D696 Thrombocytopenia, unspecified: Secondary | ICD-10-CM | POA: Insufficient documentation

## 2022-08-06 DIAGNOSIS — Z8249 Family history of ischemic heart disease and other diseases of the circulatory system: Secondary | ICD-10-CM | POA: Diagnosis not present

## 2022-08-06 DIAGNOSIS — Z79899 Other long term (current) drug therapy: Secondary | ICD-10-CM | POA: Insufficient documentation

## 2022-08-06 DIAGNOSIS — I10 Essential (primary) hypertension: Secondary | ICD-10-CM | POA: Insufficient documentation

## 2022-08-06 DIAGNOSIS — D708 Other neutropenia: Secondary | ICD-10-CM | POA: Diagnosis not present

## 2022-08-06 DIAGNOSIS — D709 Neutropenia, unspecified: Secondary | ICD-10-CM | POA: Insufficient documentation

## 2022-08-06 DIAGNOSIS — E538 Deficiency of other specified B group vitamins: Secondary | ICD-10-CM

## 2022-08-06 DIAGNOSIS — E785 Hyperlipidemia, unspecified: Secondary | ICD-10-CM | POA: Diagnosis not present

## 2022-08-06 DIAGNOSIS — Z8051 Family history of malignant neoplasm of kidney: Secondary | ICD-10-CM | POA: Insufficient documentation

## 2022-08-06 DIAGNOSIS — Z808 Family history of malignant neoplasm of other organs or systems: Secondary | ICD-10-CM | POA: Diagnosis not present

## 2022-08-06 DIAGNOSIS — Z823 Family history of stroke: Secondary | ICD-10-CM | POA: Diagnosis not present

## 2022-08-06 DIAGNOSIS — K219 Gastro-esophageal reflux disease without esophagitis: Secondary | ICD-10-CM | POA: Diagnosis not present

## 2022-08-06 LAB — CBC WITH DIFFERENTIAL/PLATELET
Abs Immature Granulocytes: 0.01 10*3/uL (ref 0.00–0.07)
Basophils Absolute: 0 10*3/uL (ref 0.0–0.1)
Basophils Relative: 1 %
Eosinophils Absolute: 0.1 10*3/uL (ref 0.0–0.5)
Eosinophils Relative: 2 %
HCT: 37.2 % (ref 36.0–46.0)
Hemoglobin: 12.3 g/dL (ref 12.0–15.0)
Immature Granulocytes: 1 %
Lymphocytes Relative: 32 %
Lymphs Abs: 0.7 10*3/uL (ref 0.7–4.0)
MCH: 28.9 pg (ref 26.0–34.0)
MCHC: 33.1 g/dL (ref 30.0–36.0)
MCV: 87.3 fL (ref 80.0–100.0)
Monocytes Absolute: 0.1 10*3/uL (ref 0.1–1.0)
Monocytes Relative: 5 %
Neutro Abs: 1.3 10*3/uL — ABNORMAL LOW (ref 1.7–7.7)
Neutrophils Relative %: 59 %
Platelets: 102 10*3/uL — ABNORMAL LOW (ref 150–400)
RBC: 4.26 MIL/uL (ref 3.87–5.11)
RDW: 14.2 % (ref 11.5–15.5)
WBC: 2.1 10*3/uL — ABNORMAL LOW (ref 4.0–10.5)
nRBC: 0 % (ref 0.0–0.2)

## 2022-08-06 LAB — VITAMIN B12: Vitamin B-12: 752 pg/mL (ref 180–914)

## 2022-08-06 NOTE — Progress Notes (Signed)
Hematology/Oncology Consult note [facility]  [phone] [phone]  Patient Care Team: Sofie Hartigan, MD as PCP - General (Family Medicine) Sindy Guadeloupe, MD as Consulting Physician (Oncology)   Name of the patient: Kaitlin Wilson  [id number]  [birth year]   Date of visit: 08/06/22  Diagnosis-chronic neutropenia and thrombocytopenia  Chief complaint/ Reason for visit-routine follow-up of neutropenia thrombocytopenia  Heme/Onc history:  patient is a 71 year old female with a past medical history significant for hypertension hyperlipidemia GERD and arthritis referred for leukopenia.  Most recent CBC from 05/19/2021 showed a white count of 2.2, H&H of 12.5/36.3 with a platelet count of 146.  Differential mainly showed neutropenia with an ANC of 1.3And lymphopenia.  2 years ago in November 2020 patient had a normal white count of 5.1 with an ANC of 3.8.  In June 2022 her white count was 2.8 with an ANC of 1.9.  Patient was found to have B12 deficiency for which she isTaking oral B12.  HIV and hepatitis C testing negative.  Peripheral flow cytometry did not reveal any immunophenotypic abnormality.  ANA comprehensive panel negative.  Interval history-patient is doing well for her age.  Remains independent of her ADLs and IADLs.  Denies any unintentional weight loss or significant fatigue.  Denies any recurrent infections or hospitalizations.  ECOG PS- 1 Pain scale- 0   Review of systems- Review of Systems  Constitutional:  Negative for chills, fever, malaise/fatigue and weight loss.  HENT:  Negative for congestion, ear discharge and nosebleeds.   Eyes:  Negative for blurred vision.  Respiratory:  Negative for cough, hemoptysis, sputum production, shortness of breath and wheezing.   Cardiovascular:  Negative for chest pain, palpitations, orthopnea and claudication.  Gastrointestinal:  Negative for abdominal pain, blood in stool,  constipation, diarrhea, heartburn, melena, nausea and vomiting.  Genitourinary:  Negative for dysuria, flank pain, frequency, hematuria and urgency.  Musculoskeletal:  Negative for back pain, joint pain and myalgias.  Skin:  Negative for rash.  Neurological:  Negative for dizziness, tingling, focal weakness, seizures, weakness and headaches.  Endo/Heme/Allergies:  Does not bruise/bleed easily.  Psychiatric/Behavioral:  Negative for depression and suicidal ideas. The patient does not have insomnia.       Allergies  Allergen Reactions   Rosuvastatin Nausea Only     Past Medical History:  Diagnosis Date   Arthritis    Dysrhythmia    GERD (gastroesophageal reflux disease)    Hyperlipidemia    Hypertension    Osteopenia of hip 2020   Bone spares   Overactive bladder    Vertigo    Vertigo      Past Surgical History:  Procedure Laterality Date   ESOPHAGOGASTRODUODENOSCOPY (EGD) WITH PROPOFOL N/A 06/16/2021   Procedure: ESOPHAGOGASTRODUODENOSCOPY (EGD) WITH PROPOFOL;  Surgeon: Lesly Rubenstein, MD;  Location: [facility] ENDOSCOPY;  Service: Endoscopy;  Laterality: N/A;   TOTAL HIP ARTHROPLASTY Right 10/24/2021   Procedure: TOTAL HIP ARTHROPLASTY ANTERIOR APPROACH;  Surgeon: Hessie Knows, MD;  Location: [facility] ORS;  Service: Orthopedics;  Laterality: Right;    Social History   Socioeconomic History   Marital status: Single    Spouse name: Not on file   Number of children: Not on file   Years of education: Not on file   Highest education level: Not on file  Occupational History   Not on file  Tobacco Use   Smoking status: Never   Smokeless tobacco: Never  Vaping Use   Vaping Use: Never used  Substance and Sexual Activity   Alcohol use: No   Drug use: No   Sexual activity: Not Currently  Other Topics Concern   Not on file  Social History Narrative   Not on file   Social Determinants of Health   Financial Resource Strain: Not on file  Food Insecurity: Not on file   Transportation Needs: Not on file  Physical Activity: Not on file  Stress: Not on file  Social Connections: Not on file  Intimate Partner Violence: Not on file    Family History  Problem Relation Age of Onset   Brain cancer Mother    Hypertension Father    Stroke Father    Kidney cancer Brother    Heart attack Brother    Bladder Cancer Neg Hx      Current Outpatient Medications:    Cholecalciferol (VITAMIN D) 2000 units tablet, Take 2,000 Units by mouth daily., Disp: , Rfl:    losartan (COZAAR) 25 MG tablet, Take 12.5 mg by mouth daily., Disp: , Rfl:    traMADol (ULTRAM) 50 MG tablet, Take 1 tablet (50 mg total) by mouth every 6 (six) hours as needed., Disp: 30 tablet, Rfl: 0   trolamine salicylate (ASPERCREME) 10 % cream, Apply 1 application topically as needed for muscle pain., Disp: , Rfl:    vitamin B-12 (CYANOCOBALAMIN) 1000 MCG tablet, Take 1,000 mcg by mouth daily., Disp: , Rfl:   Current Facility-Administered Medications:    betamethasone acetate-betamethasone sodium phosphate (CELESTONE) injection 3 mg, 3 mg, Intramuscular, Once, Edrick Kins, [place]  Physical exam:  Vitals:   08/06/22 1109  BP: (!) 151/70  Pulse: (!) 56  Resp: 16  Temp: 98.1 F (36.7 C)  TempSrc: Oral  Weight: 149 lb 4.8 oz (67.7 kg)  Height: '4\' 10"'$  (1.473 m)   Physical Exam Constitutional:      General: She is not in acute distress. Cardiovascular:     Rate and Rhythm: Normal rate and regular rhythm.     Heart sounds: Normal heart sounds.  Pulmonary:     Effort: Pulmonary effort is normal.     Breath sounds: Normal breath sounds.  Abdominal:     General: Bowel sounds are normal.     Palpations: Abdomen is soft.     Comments: No palpable splenomegaly  Lymphadenopathy:     Comments: No palpable cervical, supraclavicular, axillary or inguinal adenopathy    Skin:    General: Skin is warm and dry.  Neurological:     Mental Status: She is alert and oriented to person, place, and time.          Latest Ref Rng & Units 03/01/2022    1:26 PM  CMP  Creatinine 0.44 - 1.00 mg/dL 0.80       Latest Ref Rng & Units 08/06/2022   10:31 AM  CBC  WBC 4.0 - 10.5 K/uL 2.1   Hemoglobin 12.0 - 15.0 g/dL 12.3   Hematocrit 36.0 - 46.0 % 37.2   Platelets 150 - 400 K/uL 102     No images are attached to the encounter.  US SPLEEN (ABDOMEN LIMITED)  Addendum Date: 08/03/2022   ADDENDUM REPORT: 08/03/2022 10:10 ADDENDUM: The spleen measures 17.3 x 19.2 x 6.9 cm on the splenic ultrasound. Comparison to CT imaging is not exact given difference in modality. However, the spleen measured approximately 14.0 by 10.0 by 7.7 cm on the CT scan. The spleen measures larger on the ultrasound. However, given difference in modality, part of the  difference in measurement could be technical. Electronically Signed   By: Dorise Bullion III M.D.   On: 08/03/2022 10:10   Result Date: 08/03/2022 CLINICAL DATA:  Splenomegaly identified on previous CT imaging. EXAM: ULTRASOUND ABDOMEN LIMITED COMPARISON:  CT scan of the abdomen and pelvis March 01, 2022 FINDINGS: The spleen measures 17.3 x 19.2 by 6.9 cm with a volume of 1199 cc. No other abnormalities. IMPRESSION: Splenomegaly as above.  The splenic volume is 1199 cc. Electronically Signed: By: Dorise Bullion III M.D. On: 08/02/2022 14:14     Assessment and plan- Patient is a 71 y.o. female who is here for routine follow-up of neutropeniaAnd thrombocytopenia  Patient's white cell count has been fluctuating between 3-4 over the last couple of years.  Her white cell count did go down to 2.2 in August 2022 as well and following that it again bounced back up to 4.3.  Presently it is at 2.1 with an [place] of 1.3.  Mildly low platelets of 102 today.  I will be monitoring her CBC more closely at this time to see if she continues to wax and wane or if there is a continued downward trend in her neutrophils and platelets.  We will repeat CBC with differential in 2, 4 and 6 months  and I will see her back in 6 months  History of B12 deficiency: Levels from today are pending and she is on oral B12.   Visit Diagnosis 1. B12 deficiency   2. Other neutropenia ([place])      Dr. Randa Evens, MD, MPH [facility] at [facility] [phone] 08/06/2022 12:45 PM

## 2022-08-06 NOTE — Progress Notes (Signed)
Pt said she had UTI about month ago she has has pessary and she has had it for several years and it can cause the UTI

## 2022-09-10 DIAGNOSIS — N814 Uterovaginal prolapse, unspecified: Secondary | ICD-10-CM | POA: Diagnosis not present

## 2022-09-10 DIAGNOSIS — Z4689 Encounter for fitting and adjustment of other specified devices: Secondary | ICD-10-CM | POA: Diagnosis not present

## 2022-09-10 DIAGNOSIS — N898 Other specified noninflammatory disorders of vagina: Secondary | ICD-10-CM | POA: Diagnosis not present

## 2022-09-13 IMAGING — DX DG HIP (WITH OR WITHOUT PELVIS) 2-3V*R*
2 series · 2 of 2 positions shown · non-contrast
Comparison: None.

CLINICAL DATA: Right hip replacement

EXAM:
DG HIP (WITH OR WITHOUT PELVIS) 2-3V RIGHT; DG HIP (WITH OR WITHOUT
PELVIS) 1V RIGHT

[hip ap]
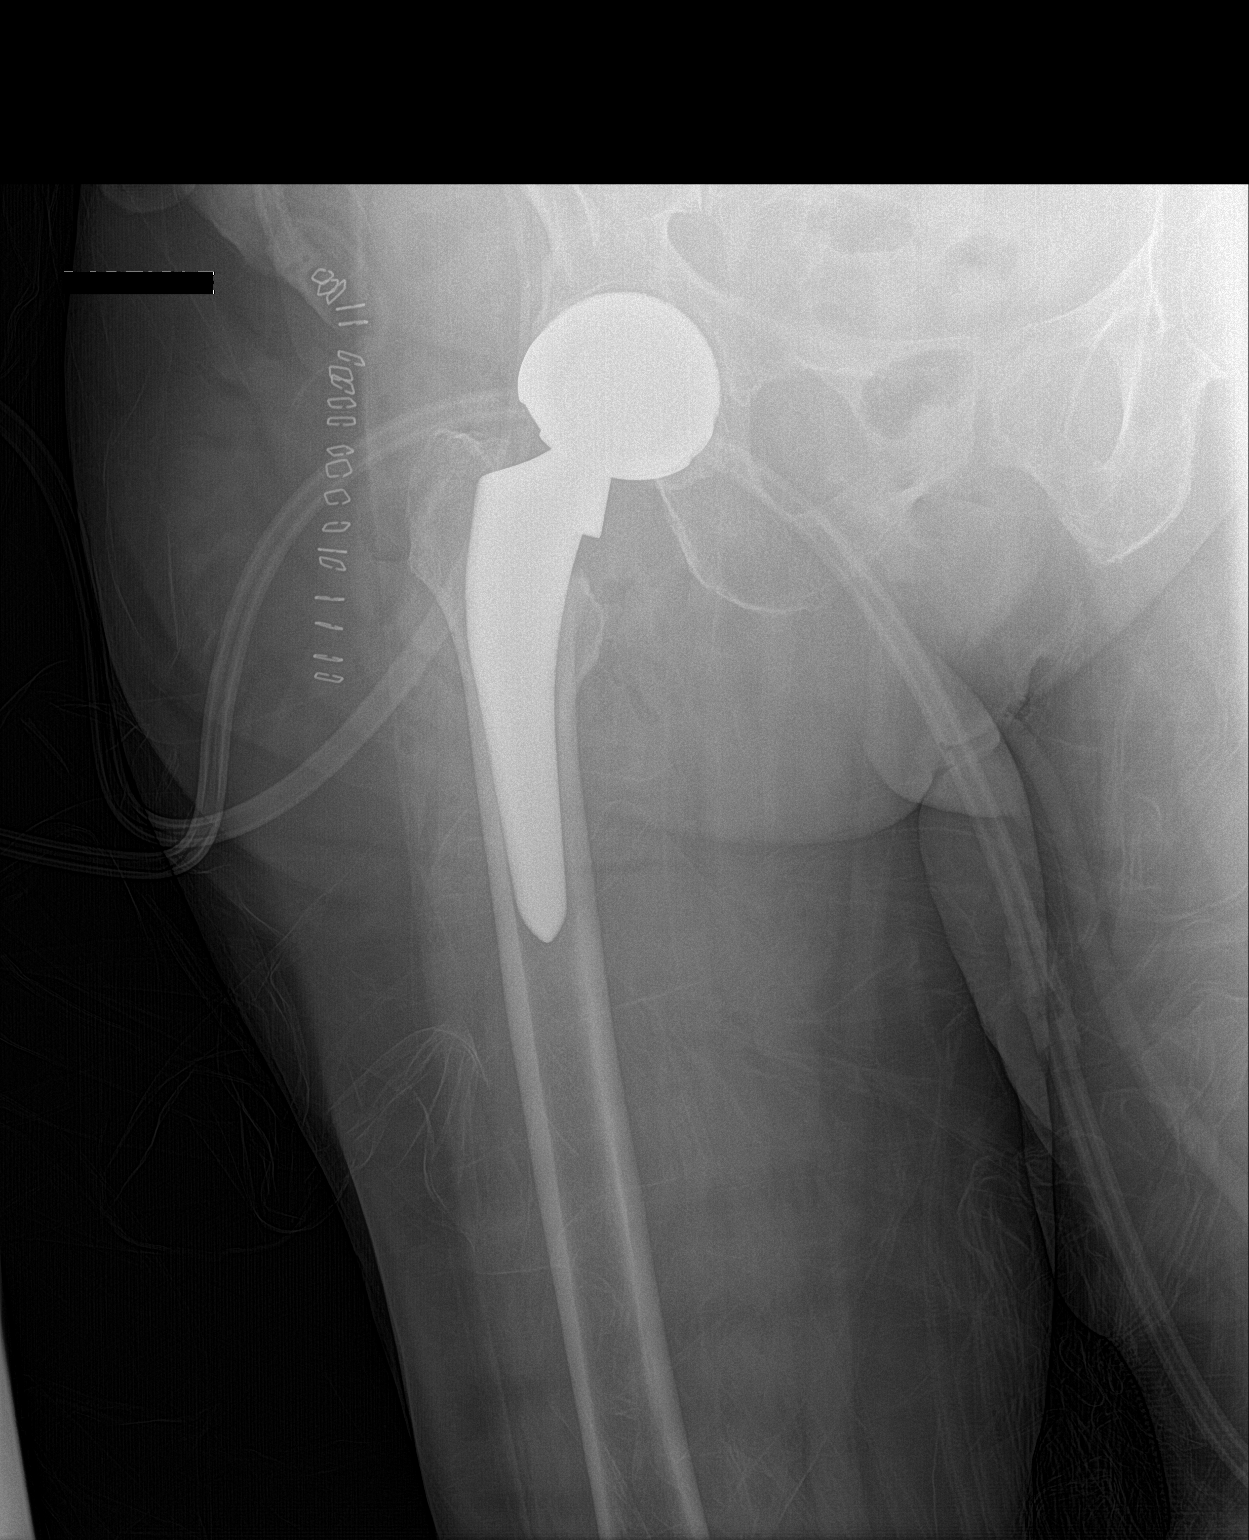

[hip lat]
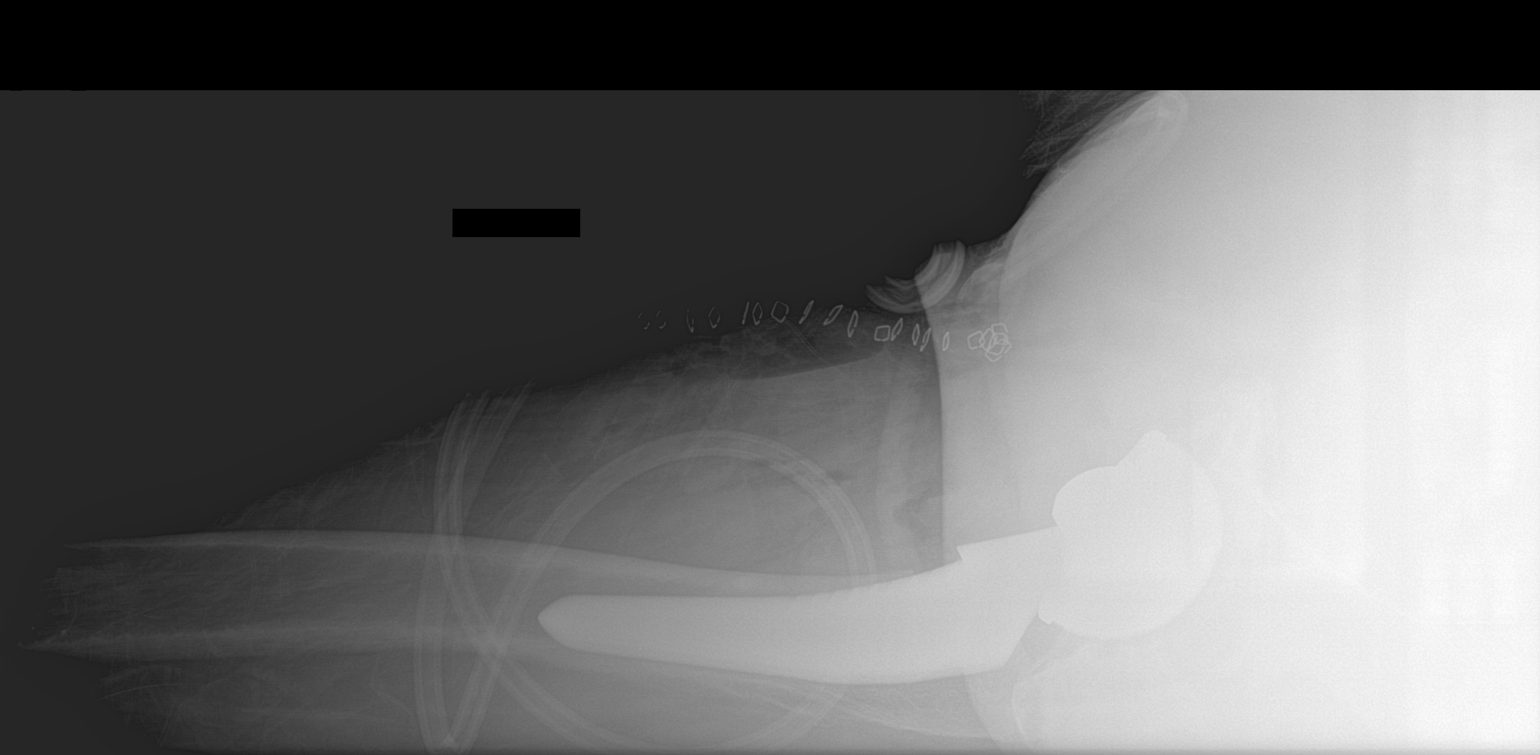

[2 of 2 positions shown; findings below may reference images not displayed]

FINDINGS: Interval right total hip arthroplasty. Normal alignment. No hardware
failure or complication. No acute fracture or dislocation.
Postsurgical changes in the soft tissues overlying the right hip.
IMPRESSION: Interval right total hip arthroplasty without failure or
complication.

## 2022-09-20 DIAGNOSIS — Z4689 Encounter for fitting and adjustment of other specified devices: Secondary | ICD-10-CM | POA: Diagnosis not present

## 2022-09-20 DIAGNOSIS — N898 Other specified noninflammatory disorders of vagina: Secondary | ICD-10-CM | POA: Diagnosis not present

## 2022-09-20 DIAGNOSIS — L929 Granulomatous disorder of the skin and subcutaneous tissue, unspecified: Secondary | ICD-10-CM | POA: Diagnosis not present

## 2022-09-20 DIAGNOSIS — N814 Uterovaginal prolapse, unspecified: Secondary | ICD-10-CM | POA: Diagnosis not present

## 2022-09-28 DIAGNOSIS — J019 Acute sinusitis, unspecified: Secondary | ICD-10-CM | POA: Diagnosis not present

## 2022-09-28 DIAGNOSIS — J101 Influenza due to other identified influenza virus with other respiratory manifestations: Secondary | ICD-10-CM | POA: Diagnosis not present

## 2022-09-28 DIAGNOSIS — Z03818 Encounter for observation for suspected exposure to other biological agents ruled out: Secondary | ICD-10-CM | POA: Diagnosis not present

## 2022-09-28 DIAGNOSIS — R509 Fever, unspecified: Secondary | ICD-10-CM | POA: Diagnosis not present

## 2022-09-28 DIAGNOSIS — J029 Acute pharyngitis, unspecified: Secondary | ICD-10-CM | POA: Diagnosis not present

## 2022-09-28 IMAGING — US US EXTREM LOW VENOUS*R*
1 series · 13 of 24 positions shown · non-contrast
Comparison: None.

CLINICAL DATA: Recent hip replacement, right lower extremity
swelling



[Series 1: us venous img lower uni right (dvt) · portal-venous · 13 of 33 slices shown]
[im 1/33]
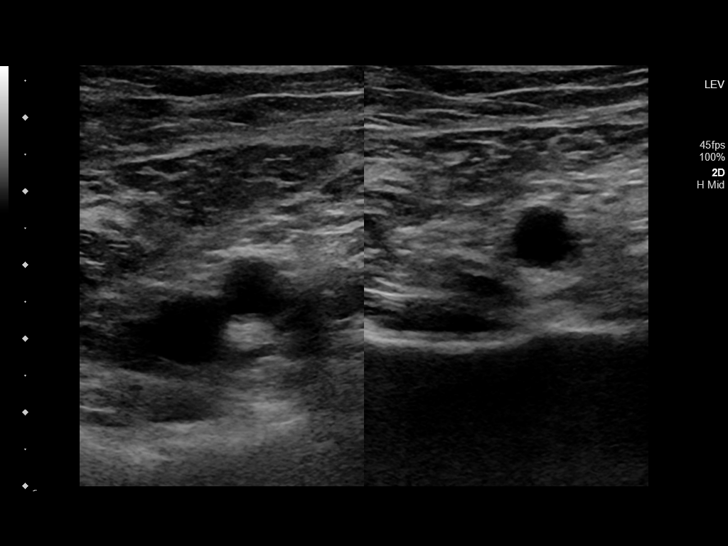
[im 3/33]
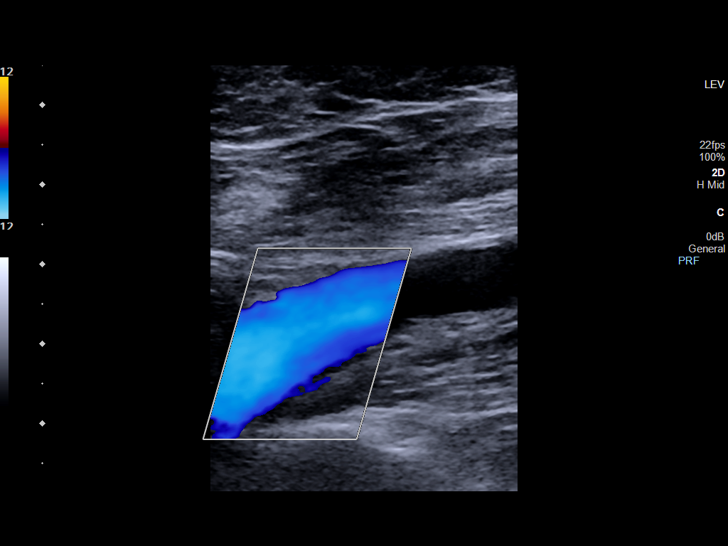
[im 6/33]
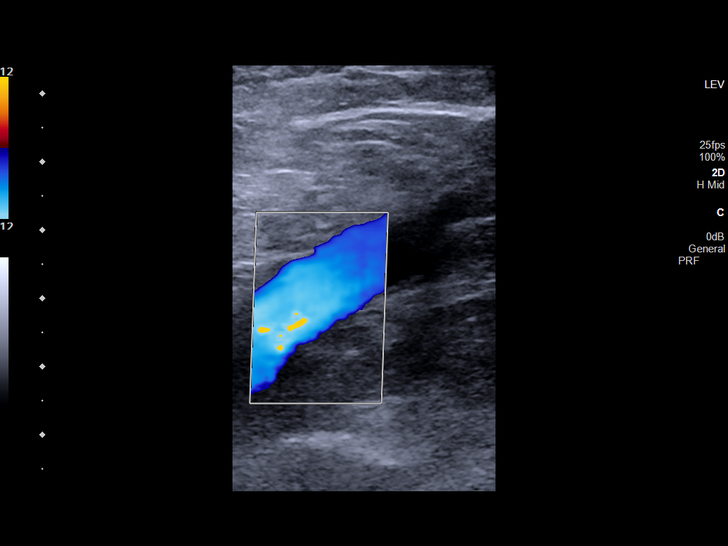
[im 9/33]
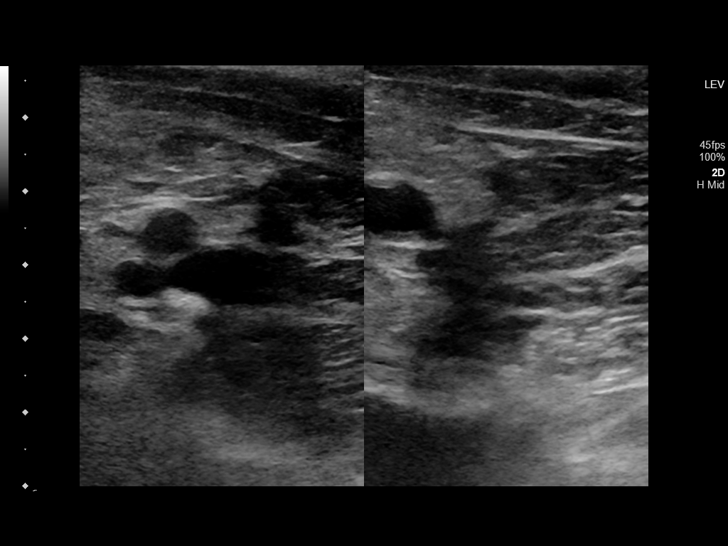
[im 12/33]
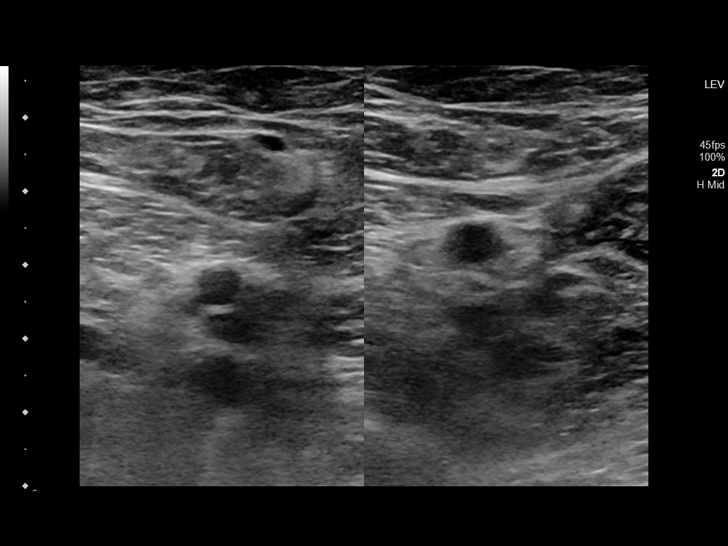
[im 14/33]
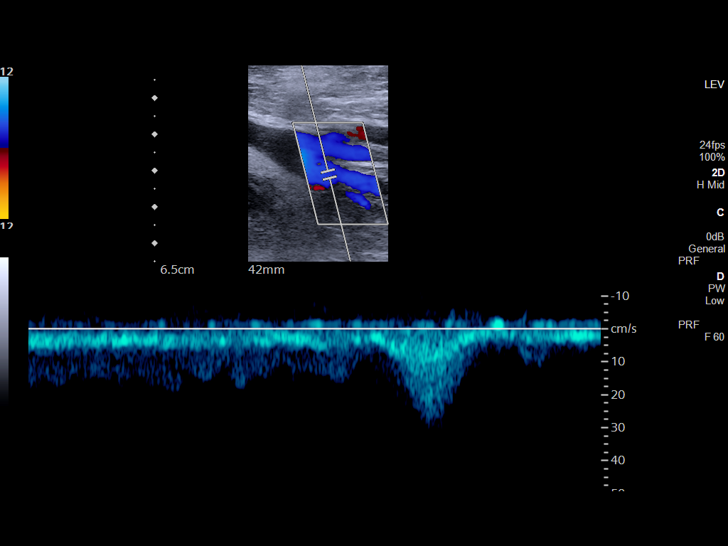
[im 17/33]
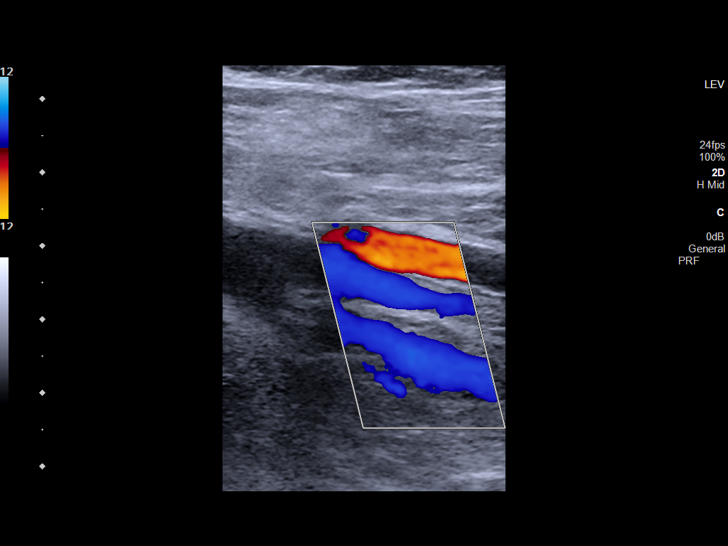
[im 19/33]
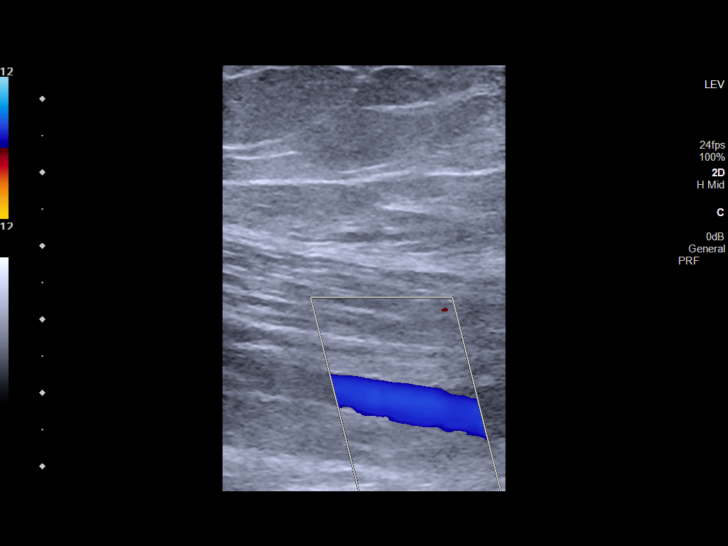
[im 21/33]
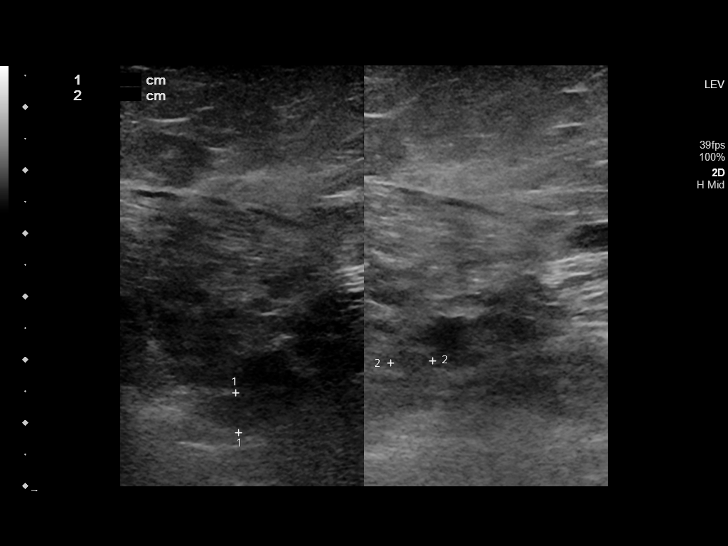
[im 24/33]
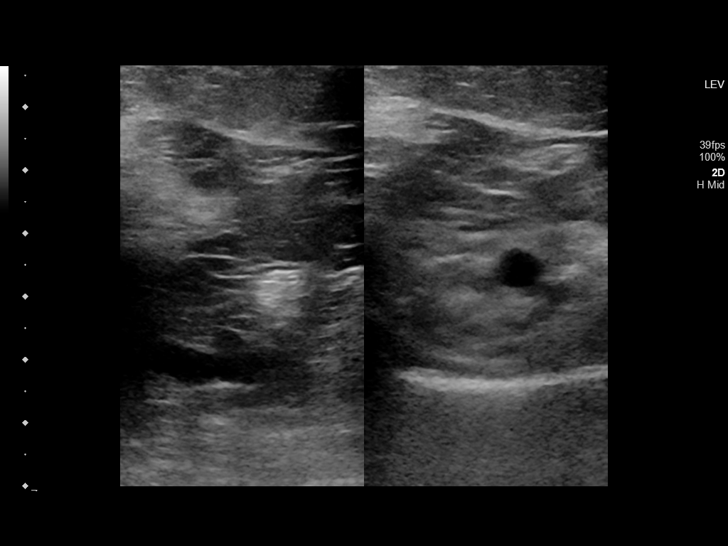
[im 27/33]
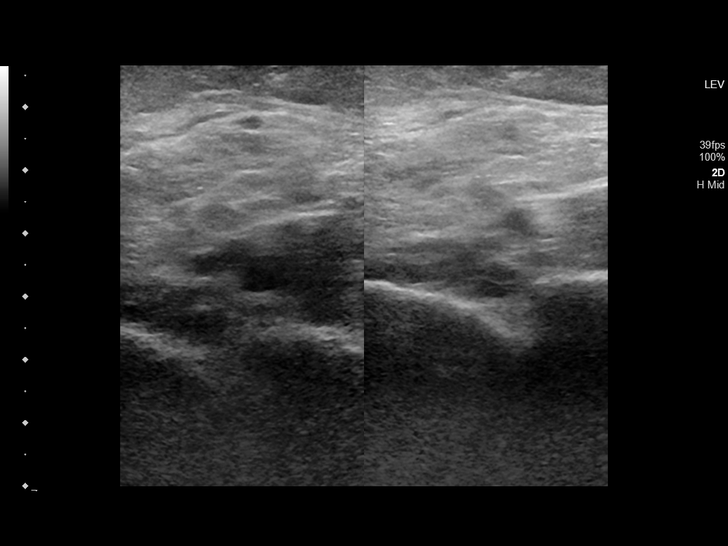
[im 30/33]
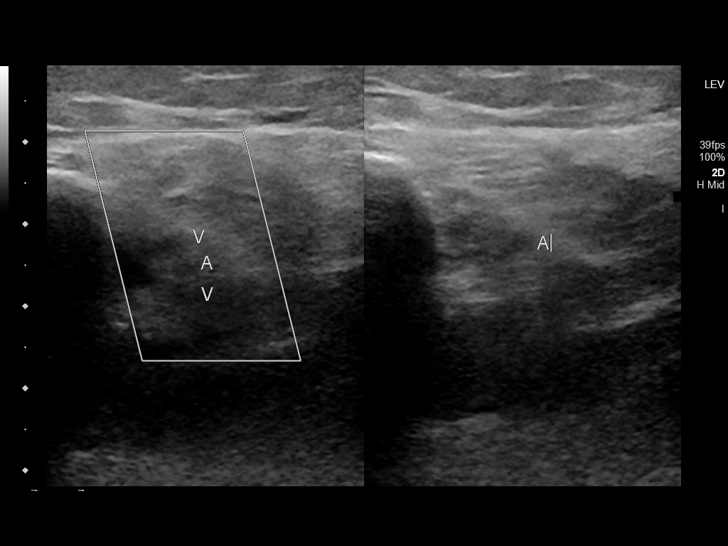
[im 33/33]
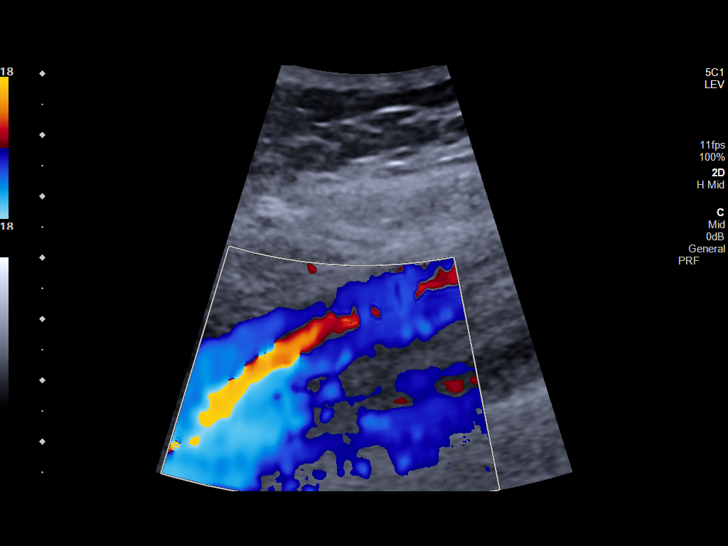

[13 of 24 positions shown; findings below may reference images not displayed]

FINDINGS: Contralateral Common Femoral Vein: Respiratory phasicity is normal
and symmetric with the symptomatic side. No evidence of thrombus.
Normal compressibility.

Common Femoral Vein: No evidence of thrombus. Normal
compressibility, respiratory phasicity and response to augmentation.

Saphenofemoral Junction: No evidence of thrombus. Normal
compressibility and flow on color Doppler imaging.

Profunda Femoral Vein: No evidence of thrombus. Normal
compressibility and flow on color Doppler imaging.

Femoral Vein: No evidence of thrombus. Normal compressibility,
respiratory phasicity and response to augmentation.

Popliteal Vein: No evidence of thrombus. Normal compressibility,
respiratory phasicity and response to augmentation.

Calf Veins: No evidence of thrombus. Normal compressibility and flow
on color Doppler imaging.
IMPRESSION: No evidence of deep venous thrombosis.

## 2022-10-08 ENCOUNTER — Other Ambulatory Visit: Payer: Medicare HMO

## 2022-10-08 ENCOUNTER — Inpatient Hospital Stay: Payer: Medicare HMO | Attending: Oncology

## 2022-10-08 DIAGNOSIS — D709 Neutropenia, unspecified: Secondary | ICD-10-CM | POA: Insufficient documentation

## 2022-10-08 DIAGNOSIS — E538 Deficiency of other specified B group vitamins: Secondary | ICD-10-CM | POA: Insufficient documentation

## 2022-10-08 LAB — CBC WITH DIFFERENTIAL/PLATELET
Abs Immature Granulocytes: 0 10*3/uL (ref 0.00–0.07)
Basophils Absolute: 0 10*3/uL (ref 0.0–0.1)
Basophils Relative: 1 %
Eosinophils Absolute: 0.1 10*3/uL (ref 0.0–0.5)
Eosinophils Relative: 3 %
HCT: 39.1 % (ref 36.0–46.0)
Hemoglobin: 13.3 g/dL (ref 12.0–15.0)
Immature Granulocytes: 0 %
Lymphocytes Relative: 35 %
Lymphs Abs: 1 10*3/uL (ref 0.7–4.0)
MCH: 29.6 pg (ref 26.0–34.0)
MCHC: 34 g/dL (ref 30.0–36.0)
MCV: 86.9 fL (ref 80.0–100.0)
Monocytes Absolute: 0.1 10*3/uL (ref 0.1–1.0)
Monocytes Relative: 5 %
Neutro Abs: 1.5 10*3/uL — ABNORMAL LOW (ref 1.7–7.7)
Neutrophils Relative %: 56 %
Platelets: 141 10*3/uL — ABNORMAL LOW (ref 150–400)
RBC: 4.5 MIL/uL (ref 3.87–5.11)
RDW: 12.8 % (ref 11.5–15.5)
WBC: 2.8 10*3/uL — ABNORMAL LOW (ref 4.0–10.5)
nRBC: 0 % (ref 0.0–0.2)

## 2022-11-14 DIAGNOSIS — I480 Paroxysmal atrial fibrillation: Secondary | ICD-10-CM | POA: Diagnosis not present

## 2022-11-14 DIAGNOSIS — M159 Polyosteoarthritis, unspecified: Secondary | ICD-10-CM | POA: Diagnosis not present

## 2022-11-14 DIAGNOSIS — I10 Essential (primary) hypertension: Secondary | ICD-10-CM | POA: Diagnosis not present

## 2022-12-05 ENCOUNTER — Other Ambulatory Visit: Payer: Medicare HMO

## 2022-12-05 ENCOUNTER — Inpatient Hospital Stay: Payer: Medicare HMO | Attending: Oncology

## 2022-12-05 DIAGNOSIS — D709 Neutropenia, unspecified: Secondary | ICD-10-CM | POA: Insufficient documentation

## 2022-12-05 DIAGNOSIS — E538 Deficiency of other specified B group vitamins: Secondary | ICD-10-CM | POA: Diagnosis not present

## 2022-12-05 LAB — CBC WITH DIFFERENTIAL/PLATELET
Abs Immature Granulocytes: 0.01 10*3/uL (ref 0.00–0.07)
Basophils Absolute: 0 10*3/uL (ref 0.0–0.1)
Basophils Relative: 1 %
Eosinophils Absolute: 0.1 10*3/uL (ref 0.0–0.5)
Eosinophils Relative: 4 %
HCT: 40.2 % (ref 36.0–46.0)
Hemoglobin: 13.5 g/dL (ref 12.0–15.0)
Immature Granulocytes: 0 %
Lymphocytes Relative: 33 %
Lymphs Abs: 0.8 10*3/uL (ref 0.7–4.0)
MCH: 29.9 pg (ref 26.0–34.0)
MCHC: 33.6 g/dL (ref 30.0–36.0)
MCV: 88.9 fL (ref 80.0–100.0)
Monocytes Absolute: 0.1 10*3/uL (ref 0.1–1.0)
Monocytes Relative: 6 %
Neutro Abs: 1.3 10*3/uL — ABNORMAL LOW (ref 1.7–7.7)
Neutrophils Relative %: 56 %
Platelets: 122 10*3/uL — ABNORMAL LOW (ref 150–400)
RBC: 4.52 MIL/uL (ref 3.87–5.11)
RDW: 12.9 % (ref 11.5–15.5)
WBC: 2.4 10*3/uL — ABNORMAL LOW (ref 4.0–10.5)
nRBC: 0 % (ref 0.0–0.2)

## 2022-12-06 DIAGNOSIS — I480 Paroxysmal atrial fibrillation: Secondary | ICD-10-CM | POA: Diagnosis not present

## 2022-12-06 DIAGNOSIS — M159 Polyosteoarthritis, unspecified: Secondary | ICD-10-CM | POA: Diagnosis not present

## 2022-12-06 DIAGNOSIS — I10 Essential (primary) hypertension: Secondary | ICD-10-CM | POA: Diagnosis not present

## 2022-12-06 DIAGNOSIS — K219 Gastro-esophageal reflux disease without esophagitis: Secondary | ICD-10-CM | POA: Diagnosis not present

## 2022-12-06 DIAGNOSIS — E7800 Pure hypercholesterolemia, unspecified: Secondary | ICD-10-CM | POA: Diagnosis not present

## 2022-12-06 DIAGNOSIS — R7302 Impaired glucose tolerance (oral): Secondary | ICD-10-CM | POA: Diagnosis not present

## 2022-12-06 DIAGNOSIS — I700 Atherosclerosis of aorta: Secondary | ICD-10-CM | POA: Diagnosis not present

## 2022-12-06 DIAGNOSIS — N3281 Overactive bladder: Secondary | ICD-10-CM | POA: Diagnosis not present

## 2022-12-06 DIAGNOSIS — R42 Dizziness and giddiness: Secondary | ICD-10-CM | POA: Diagnosis not present

## 2022-12-19 DIAGNOSIS — H5203 Hypermetropia, bilateral: Secondary | ICD-10-CM | POA: Diagnosis not present

## 2022-12-25 DIAGNOSIS — Z4689 Encounter for fitting and adjustment of other specified devices: Secondary | ICD-10-CM | POA: Diagnosis not present

## 2022-12-25 DIAGNOSIS — N898 Other specified noninflammatory disorders of vagina: Secondary | ICD-10-CM | POA: Diagnosis not present

## 2022-12-25 DIAGNOSIS — N814 Uterovaginal prolapse, unspecified: Secondary | ICD-10-CM | POA: Diagnosis not present

## 2023-01-19 IMAGING — CT CT ABD-PELV W/ CM
2 of 5 series · 16 of 46 positions shown, 18 images · IV contrast (APPLIED)
Comparison: None Available.

CLINICAL DATA: Abdominal pain and cramping and diarrhea for 7
months. Gastroesophageal reflux disease.

EXAM:
CT ABDOMEN AND PELVIS WITH CONTRAST
TECHNIQUE: Multidetector CT imaging of the abdomen and pelvis was performed
using the standard protocol following bolus administration of
intravenous contrast.

[Series 2: axial (person_name) · axial · 0.76mm/px · z∈[-717,-342]mm · 13 of 87 slices shown, 15 images]
[im 6/87  soft-tissue]
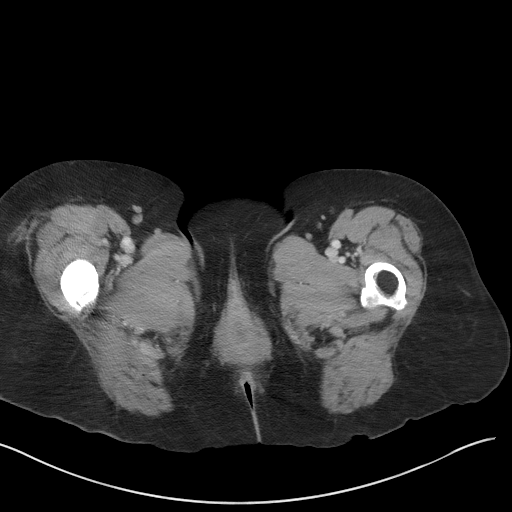
[im 6/87  bone]
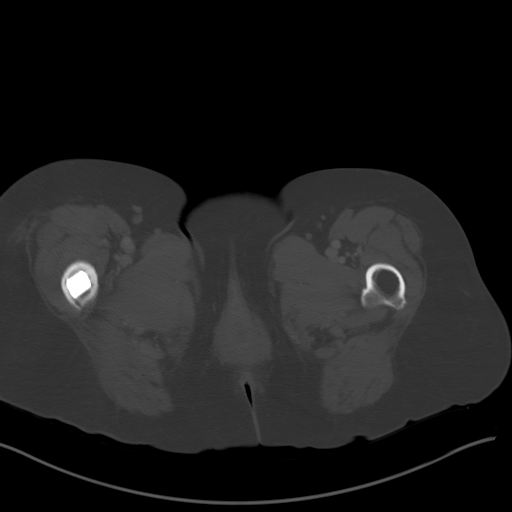
[im 11/87  soft-tissue]
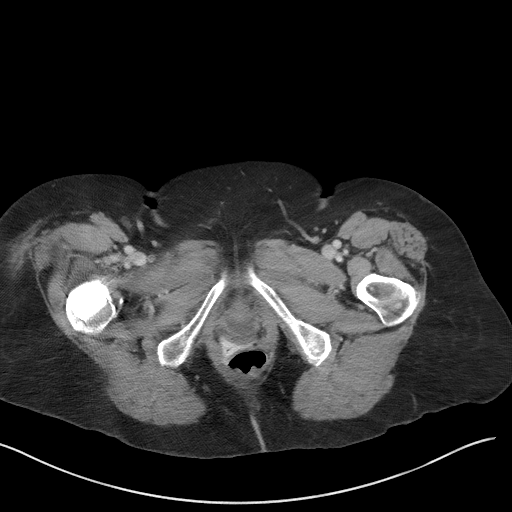
[im 17/87  soft-tissue]
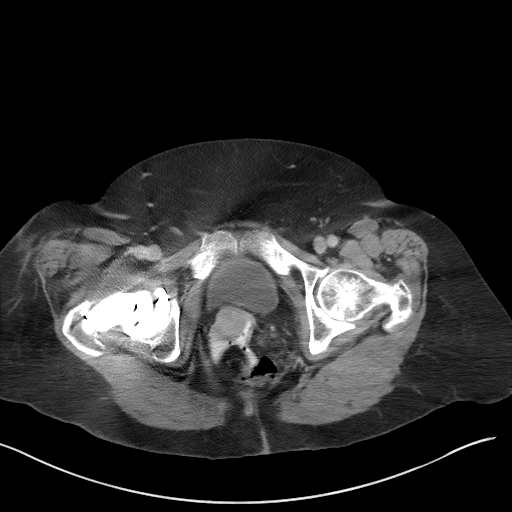
[im 27/87  soft-tissue]
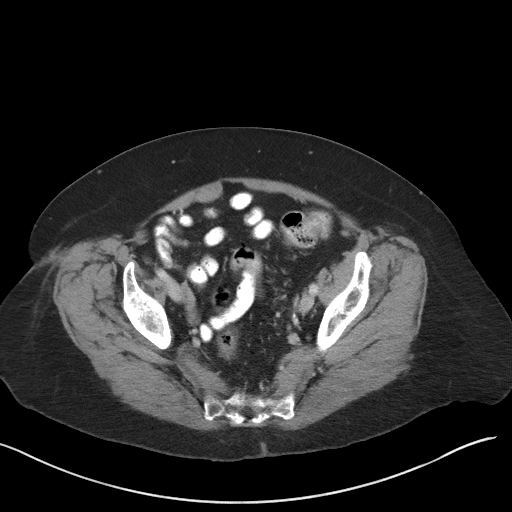
[im 33/87  soft-tissue]
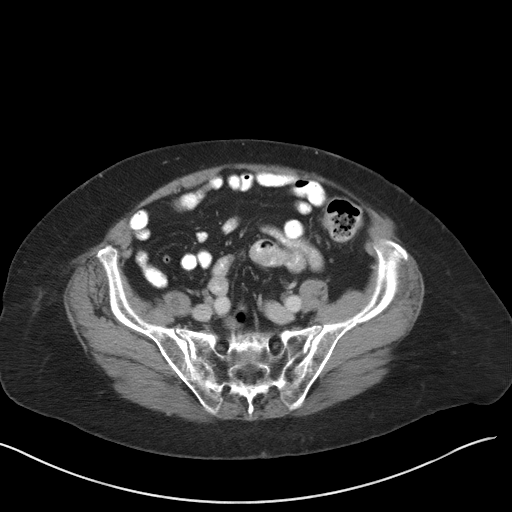
[im 38/87  soft-tissue]
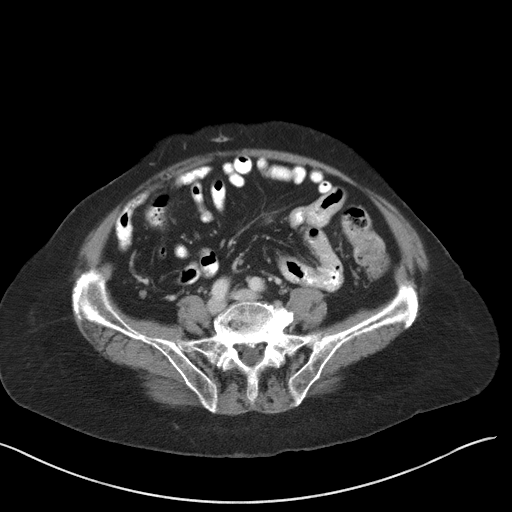
[im 44/87  soft-tissue]
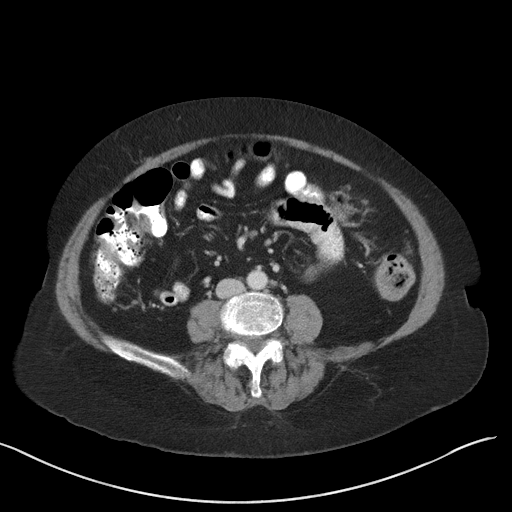
[im 49/87  soft-tissue]
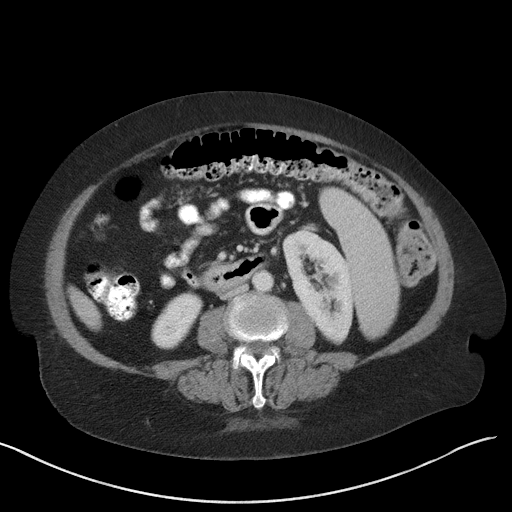
[im 54/87  soft-tissue]
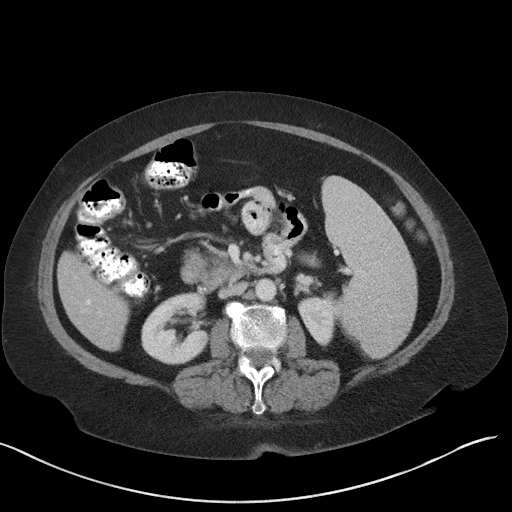
[im 54/87  bone]
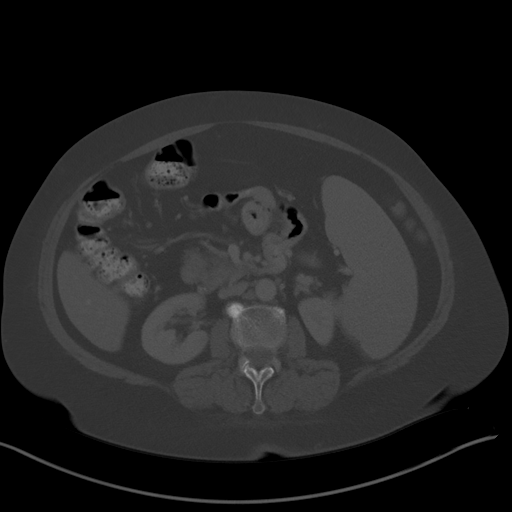
[im 60/87  soft-tissue]
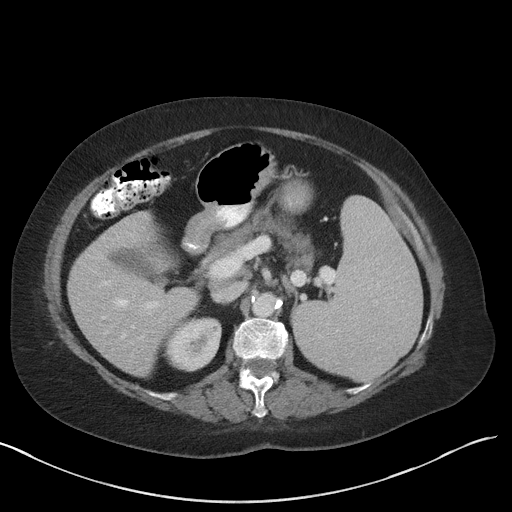
[im 70/87  soft-tissue]
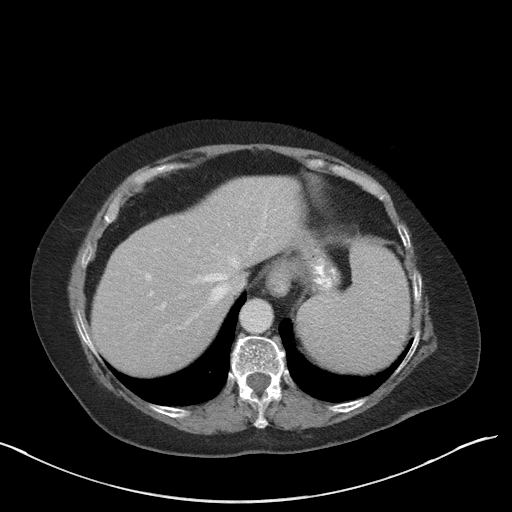
[im 76/87  soft-tissue]
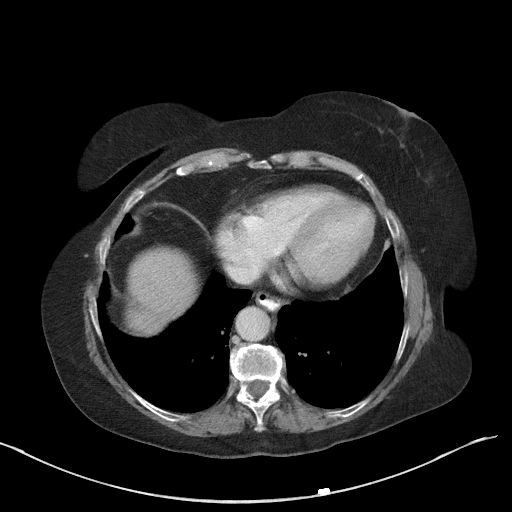
[im 81/87  soft-tissue]
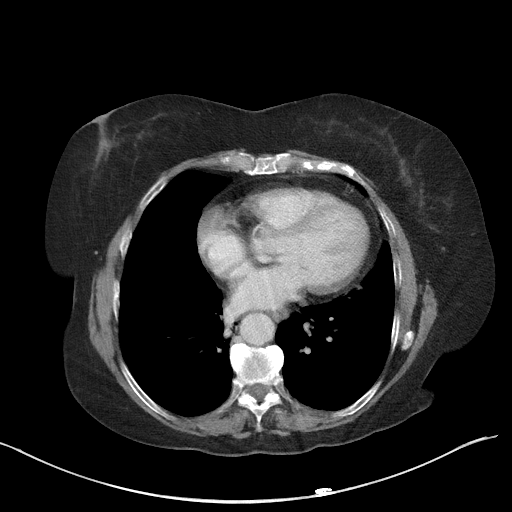

[Series 5: coronal (person_name) · coronal · 0.78mm/px · 3 of 87 slices shown]
[im 29/87  soft-tissue]
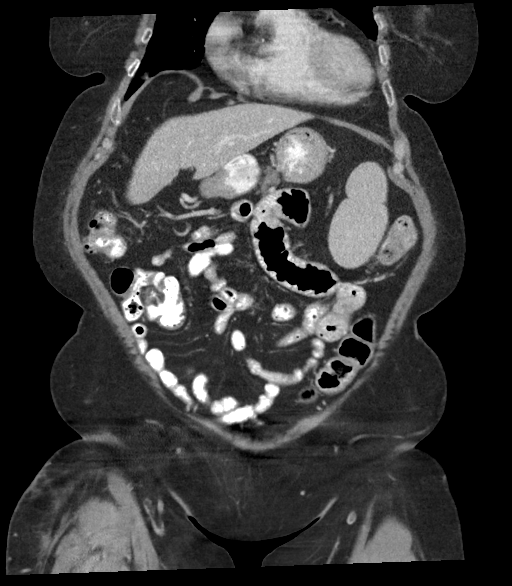
[im 39/87  soft-tissue]
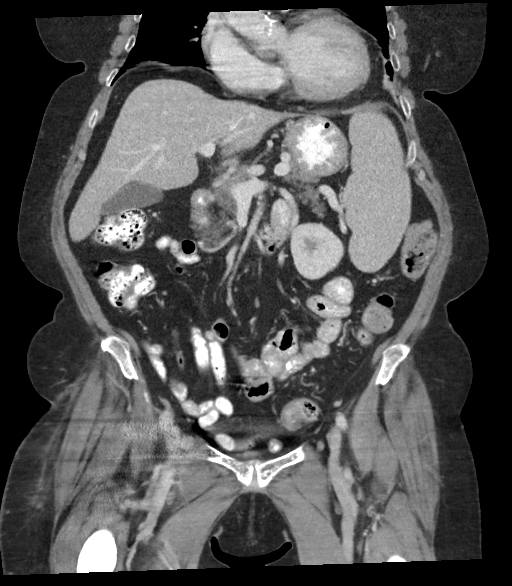
[im 48/87  soft-tissue]
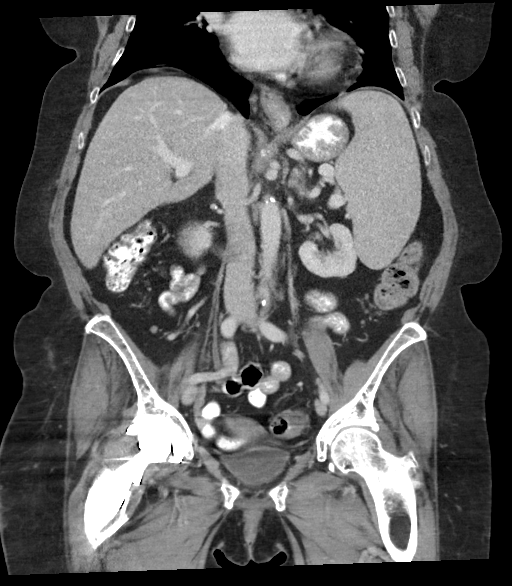

[16 of 46 positions shown; findings below may reference images not displayed]

RADIATION DOSE REDUCTION: This exam was performed according to the
departmental dose-optimization program which includes automated
exposure control, adjustment of the mA and/or kV according to
patient size and/or use of iterative reconstruction technique.

CONTRAST:  100mL OMNIPAQUE IOHEXOL 300 MG/ML  SOLN
FINDINGS: Lower Chest: No acute findings.

Hepatobiliary: No hepatic masses identified. Gallbladder is
unremarkable. No evidence of biliary ductal dilatation.

Pancreas:  No mass or inflammatory changes.

Spleen: Mild splenomegaly seen with length measuring approximately
14 cm. No splenic lesions identified.

Adrenals/Urinary Tract: Right adrenal calcifications noted,
consistent with remote hemorrhage or infection. No renal masses
identified. No evidence of ureteral calculi or hydronephrosis.
Pelvic floor laxity and cystocele are seen.

Stomach/Bowel: No evidence of obstruction, inflammatory process or
abnormal fluid collections. Aortic atherosclerotic calcification
incidentally noted.

Vascular/Lymphatic: No pathologically enlarged lymph nodes. No acute
vascular findings. Aortic atherosclerotic calcification incidentally
noted.

Reproductive: No mass or other significant abnormality. Pessary
noted within the vagina.

Other:  None.

Musculoskeletal: No suspicious bone lesions identified. Right hip
prosthesis noted.
IMPRESSION: Mild splenomegaly.

No soft tissue mass or lymphadenopathy identified within the abdomen
or pelvis.

Pelvic floor laxity with cystocele.

Aortic Atherosclerosis ([id number]).

## 2023-02-04 ENCOUNTER — Encounter: Payer: Self-pay | Admitting: Oncology

## 2023-02-04 ENCOUNTER — Inpatient Hospital Stay: Payer: Medicare HMO | Attending: Oncology

## 2023-02-04 ENCOUNTER — Inpatient Hospital Stay (HOSPITAL_BASED_OUTPATIENT_CLINIC_OR_DEPARTMENT_OTHER): Payer: Medicare HMO | Admitting: Oncology

## 2023-02-04 VITALS — BP 143/67 | HR 58 | Temp 97.4°F | Resp 18 | Ht <= 58 in | Wt 158.1 lb

## 2023-02-04 DIAGNOSIS — D696 Thrombocytopenia, unspecified: Secondary | ICD-10-CM

## 2023-02-04 DIAGNOSIS — E538 Deficiency of other specified B group vitamins: Secondary | ICD-10-CM

## 2023-02-04 DIAGNOSIS — Z8051 Family history of malignant neoplasm of kidney: Secondary | ICD-10-CM | POA: Insufficient documentation

## 2023-02-04 DIAGNOSIS — D709 Neutropenia, unspecified: Secondary | ICD-10-CM

## 2023-02-04 DIAGNOSIS — R161 Splenomegaly, not elsewhere classified: Secondary | ICD-10-CM | POA: Insufficient documentation

## 2023-02-04 DIAGNOSIS — Z808 Family history of malignant neoplasm of other organs or systems: Secondary | ICD-10-CM | POA: Diagnosis not present

## 2023-02-04 LAB — CBC WITH DIFFERENTIAL/PLATELET
Abs Immature Granulocytes: 0 10*3/uL (ref 0.00–0.07)
Basophils Absolute: 0 10*3/uL (ref 0.0–0.1)
Basophils Relative: 1 %
Eosinophils Absolute: 0.1 10*3/uL (ref 0.0–0.5)
Eosinophils Relative: 4 %
HCT: 38.1 % (ref 36.0–46.0)
Hemoglobin: 12.6 g/dL (ref 12.0–15.0)
Immature Granulocytes: 0 %
Lymphocytes Relative: 34 %
Lymphs Abs: 0.8 10*3/uL (ref 0.7–4.0)
MCH: 29.6 pg (ref 26.0–34.0)
MCHC: 33.1 g/dL (ref 30.0–36.0)
MCV: 89.4 fL (ref 80.0–100.0)
Monocytes Absolute: 0.1 10*3/uL (ref 0.1–1.0)
Monocytes Relative: 6 %
Neutro Abs: 1.2 10*3/uL — ABNORMAL LOW (ref 1.7–7.7)
Neutrophils Relative %: 55 %
Platelets: 111 10*3/uL — ABNORMAL LOW (ref 150–400)
RBC: 4.26 MIL/uL (ref 3.87–5.11)
RDW: 13 % (ref 11.5–15.5)
WBC: 2.2 10*3/uL — ABNORMAL LOW (ref 4.0–10.5)
nRBC: 0 % (ref 0.0–0.2)

## 2023-02-04 LAB — VITAMIN B12: Vitamin B-12: 1078 pg/mL — ABNORMAL HIGH (ref 180–914)

## 2023-02-04 NOTE — Progress Notes (Signed)
Hematology/Oncology Consult note [facility]  [phone] [phone]  Patient Care Team: Marina Goodell, MD as PCP - General (Family Medicine) Creig Hines, MD as Consulting Physician (Oncology)   Name of the patient: Kaitlin Wilson  [id number]  Feb 17, 1951   Date of visit: 02/04/23  Diagnosis-chronic neutropenia and thrombocytopenia of unclear etiology  Chief complaint/ Reason for visit-routine follow-up of neutropenia and thrombocytopenia  Heme/Onc history: patient is a 72 year old female with a past medical history significant for hypertension hyperlipidemia GERD and arthritis referred for leukopenia.  Most recent CBC from 05/19/2021 showed a white count of 2.2, H&H of 12.5/36.3 with a platelet count of 146.  Differential mainly showed neutropenia with an ANC of 1.3And lymphopenia.  2 years ago in November 2020 patient had a normal white count of 5.1 with an ANC of 3.8.  In June 2022 her white count was 2.8 with an ANC of 1.9.  Patient was found to have B12 deficiency for which she isTaking oral B12.  HIV and hepatitis C testing negative.  Peripheral flow cytometry did not reveal any immunophenotypic abnormality.  ANA comprehensive panel negative.   Interval history-patient feels well overall today.  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  No recent infections or hospitalizations.  ECOG PS- 0 Pain scale- 0   Review of systems- Review of Systems  Constitutional:  Negative for chills, fever, malaise/fatigue and weight loss.  HENT:  Negative for congestion, ear discharge and nosebleeds.   Eyes:  Negative for blurred vision.  Respiratory:  Negative for cough, hemoptysis, sputum production, shortness of breath and wheezing.   Cardiovascular:  Negative for chest pain, palpitations, orthopnea and claudication.   Gastrointestinal:  Negative for abdominal pain, blood in stool, constipation, diarrhea, heartburn, melena, nausea and vomiting.  Genitourinary:  Negative for dysuria, flank pain, frequency, hematuria and urgency.  Musculoskeletal:  Negative for back pain, joint pain and myalgias.  Skin:  Negative for rash.  Neurological:  Negative for dizziness, tingling, focal weakness, seizures, weakness and headaches.  Endo/Heme/Allergies:  Does not bruise/bleed easily.  Psychiatric/Behavioral:  Negative for depression and suicidal ideas. The patient does not have insomnia.       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   Procedure: TOTAL HIP ARTHROPLASTY ANTERIOR APPROACH;  Surgeon: Kennedy Bucker, MD;  Location: [facility] ORS;  Service: Orthopedics;  Laterality: Right;    Social History   Socioeconomic History   Marital status: Single    Spouse name: Not on file   Number of children: Not on file   Years of education: Not on file   Highest education level: Not on file  Occupational History   Not on file  Tobacco Use   Smoking status: Never  Smokeless tobacco: Never  Vaping Use   Vaping Use: Never used  Substance and Sexual Activity   Alcohol use: No   Drug use: No   Sexual activity: Not Currently  Other Topics Concern   Not on file  Social History Narrative   Not on file   Social Determinants of Health   Financial  Resource Strain: Not on file  Food Insecurity: Not on file  Transportation Needs: Not on file  Physical Activity: Not on file  Stress: Not on file  Social Connections: Not on file  Intimate Partner Violence: Not on file    Family History  Problem Relation Age of Onset   Brain cancer Mother    Hypertension Father    Stroke Father    Kidney cancer Brother    Heart attack Brother    Bladder Cancer Neg Hx      Current Outpatient Medications:    atorvastatin (LIPITOR) 10 MG tablet, Take 1 tablet by mouth daily., Disp: , Rfl:    Cholecalciferol (VITAMIN D) 2000 units tablet, Take 2,000 Units by mouth daily., Disp: , Rfl:    losartan (COZAAR) 25 MG tablet, Take 12.5 mg by mouth daily., Disp: , Rfl:    traMADol (ULTRAM) 50 MG tablet, Take 1 tablet (50 mg total) by mouth every 6 (six) hours as needed., Disp: 30 tablet, Rfl: 0   trolamine salicylate (ASPERCREME) 10 % cream, Apply 1 application topically as needed for muscle pain., Disp: , Rfl:    vitamin B-12 (CYANOCOBALAMIN) 1000 MCG tablet, Take 1,000 mcg by mouth daily., Disp: , Rfl:   Current Facility-Administered Medications:    betamethasone acetate-betamethasone sodium phosphate (CELESTONE) injection 3 mg, 3 mg, Intramuscular, Once, Felecia Shelling, [place]  Physical exam:  Vitals:   02/04/23 1019 02/04/23 1027  BP: (!) 151/71 (!) 143/67  Pulse: 64 (!) 58  Resp: 18   Temp: (!) 97.4 F (36.3 C)   TempSrc: Tympanic   SpO2: 100%   Weight: 158 lb 1.6 oz (71.7 kg)   Height: 4\' 9"  (1.448 m)    Physical Exam Cardiovascular:     Rate and Rhythm: Normal rate and regular rhythm.     Heart sounds: Normal heart sounds.  Pulmonary:     Effort: Pulmonary effort is normal.     Breath sounds: Normal breath sounds.  Abdominal:     General: Bowel sounds are normal.     Palpations: Abdomen is soft.     Comments: No palpable splenomegaly  Lymphadenopathy:     Comments: No palpable cervical, supraclavicular, axillary or inguinal  adenopathy    Skin:    General: Skin is warm and dry.  Neurological:     Mental Status: She is alert and oriented to person, place, and time.        Latest Ref Rng & Units 03/01/2022    1:26 PM  CMP  Creatinine 0.44 - 1.00 mg/dL 1.61       Latest Ref Rng & Units 02/04/2023   10:00 AM  CBC  WBC 4.0 - 10.5 K/uL 2.2   Hemoglobin 12.0 - 15.0 g/dL 09.6   Hematocrit 04.5 - 46.0 % 38.1   Platelets 150 - 400 K/uL 111      Assessment and plan- Patient is a 72 y.o. female here for routine follow-up of neutropenia and thrombocytopenia  Discussed with the patient that her white cell count has been between 2-3 for the last 6 months with a neutrophil count that has fluctuated between 1.2-1.5.  Thrombocytopenia has been in the 100s to 110s.  Prior to that her platelet counts were in the 130s in January of last year and white cell count was also better.  Hemoglobin has remained stable.  Because of her neutropenia and thrombocytopenia is presently unclear.  She wasWas noted to have splenomegaly on her scans which has remained stable over the last year.  We discussed proceeding with bone marrow biopsy to a certain the cause of neutropenia and thrombocytopenia.  Patient feels well at this time and does not wish to get a bone marrow biopsy.  She wishes to continue watchful monitoring at this time.  I will repeat CBC with differential in 3 months in 6 months.  See her back in 6 months with an ultrasound abdomen prior   Visit Diagnosis 1. Chronic neutropenia (HCC)   2. Thrombocytopenia (HCC)   3. Splenomegaly      Dr. Owens Shark, MD, MPH [facility] at [facility] [phone] 02/04/2023 11:49 AM

## 2023-02-04 NOTE — Addendum Note (Signed)
Addended by: Corene Cornea on: 02/04/2023 01:15 PM   Modules accepted: Orders

## 2023-03-19 DIAGNOSIS — R42 Dizziness and giddiness: Secondary | ICD-10-CM | POA: Diagnosis not present

## 2023-03-19 DIAGNOSIS — H6121 Impacted cerumen, right ear: Secondary | ICD-10-CM | POA: Diagnosis not present

## 2023-03-19 DIAGNOSIS — W010XXA Fall on same level from slipping, tripping and stumbling without subsequent striking against object, initial encounter: Secondary | ICD-10-CM | POA: Diagnosis not present

## 2023-03-22 ENCOUNTER — Other Ambulatory Visit: Payer: Self-pay

## 2023-03-22 ENCOUNTER — Emergency Department
Admission: EM | Admit: 2023-03-22 | Discharge: 2023-03-22 | Disposition: A | Discharge status: Home / Self Care | Payer: Medicare HMO | Attending: Emergency Medicine | Admitting: Emergency Medicine

## 2023-03-22 ENCOUNTER — Emergency Department: Payer: Medicare HMO

## 2023-03-22 ENCOUNTER — Encounter: Payer: Self-pay | Admitting: Emergency Medicine

## 2023-03-22 DIAGNOSIS — W1830XA Fall on same level, unspecified, initial encounter: Secondary | ICD-10-CM | POA: Insufficient documentation

## 2023-03-22 DIAGNOSIS — I10 Essential (primary) hypertension: Secondary | ICD-10-CM | POA: Diagnosis not present

## 2023-03-22 DIAGNOSIS — S0990XA Unspecified injury of head, initial encounter: Secondary | ICD-10-CM

## 2023-03-22 DIAGNOSIS — R42 Dizziness and giddiness: Secondary | ICD-10-CM

## 2023-03-22 DIAGNOSIS — R531 Weakness: Secondary | ICD-10-CM | POA: Diagnosis not present

## 2023-03-22 DIAGNOSIS — S40022A Contusion of left upper arm, initial encounter: Secondary | ICD-10-CM | POA: Diagnosis not present

## 2023-03-22 DIAGNOSIS — W19XXXA Unspecified fall, initial encounter: Secondary | ICD-10-CM | POA: Diagnosis not present

## 2023-03-22 DIAGNOSIS — I6523 Occlusion and stenosis of bilateral carotid arteries: Secondary | ICD-10-CM | POA: Diagnosis not present

## 2023-03-22 DIAGNOSIS — R0902 Hypoxemia: Secondary | ICD-10-CM | POA: Diagnosis not present

## 2023-03-22 LAB — BASIC METABOLIC PANEL
Anion gap: 10 (ref 5–15)
BUN: 17 mg/dL (ref 8–23)
CO2: 23 mmol/L (ref 22–32)
Calcium: 9.3 mg/dL (ref 8.9–10.3)
Chloride: 105 mmol/L (ref 98–111)
Creatinine, Ser: 0.78 mg/dL (ref 0.44–1.00)
GFR, Estimated: >60 mL/min (ref >60)
Glucose, Bld: 117 mg/dL — ABNORMAL HIGH (ref 70–99)
Potassium: 4.2 mmol/L (ref 3.5–5.1)
Sodium: 138 mmol/L (ref 135–145)

## 2023-03-22 LAB — CBC
HCT: 40.5 % (ref 36.0–46.0)
Hemoglobin: 13.6 g/dL (ref 12.0–15.0)
MCH: 30.1 pg (ref 26.0–34.0)
MCHC: 33.6 g/dL (ref 30.0–36.0)
MCV: 89.6 fL (ref 80.0–100.0)
Platelets: 113 10*3/uL — ABNORMAL LOW (ref 150–400)
RBC: 4.52 MIL/uL (ref 3.87–5.11)
RDW: 13.1 % (ref 11.5–15.5)
WBC: 2.4 10*3/uL — ABNORMAL LOW (ref 4.0–10.5)
nRBC: 0 % (ref 0.0–0.2)

## 2023-03-22 LAB — MAGNESIUM: Magnesium: 2.2 mg/dL (ref 1.7–2.4)

## 2023-03-22 MED ORDER — PREDNISONE 10 MG PO TABS: ORAL_TABLET | ORAL | 0 refills | Status: AC

## 2023-03-22 NOTE — ED Provider Notes (Signed)
[facility] Provider Note    Event Date/Time   First MD Initiated Contact with Patient 03/22/23 [phone]     (approximate)   History   Fall (/)   HPI  Kaitlin Wilson is a 72 y.o. female past medical history significant for hypertension, hyperlipidemia, GERD, who presents to the emergency department following a fall.  Patient states that over the past months she has developed episodes of vertigo.  Evaluated at primary care physician office.  Today had an episode of vertigo that was sudden onset causing her to fall and hit her head.  Not on anticoagulation.  Denies any ongoing vertigo symptoms at this time.  Denies any extremity numbness or weakness.  States that she recently had cerumen faction and was started on meclizine without any improvement of her symptoms.     Physical Exam   Triage Vital Signs: ED Triage Vitals  Enc Vitals Group     BP 03/22/23 0813 (!) 140/67     Pulse Rate 03/22/23 0813 67     Resp 03/22/23 0813 19     Temp 03/22/23 0813 98.7 F (37.1 C)     Temp Source 03/22/23 0813 Oral     SpO2 03/22/23 0813 98 %     Weight 03/22/23 0807 158 lb 8.2 oz (71.9 kg)     Height 03/22/23 0807 4\' 9"  (1.448 m)     Head Circumference --      Peak Flow --      Pain Score --      Pain Loc --      Pain Edu? --      Excl. in GC? --     Most recent vital signs: Vitals:   03/22/23 0813  BP: (!) 140/67  Pulse: 67  Resp: 19  Temp: 98.7 F (37.1 C)  SpO2: 98%    Physical Exam Constitutional:      Appearance: She is well-developed.  HENT:     Head: Atraumatic.  Eyes:     Conjunctiva/sclera: Conjunctivae normal.  Cardiovascular:     Rate and Rhythm: Regular rhythm.  Pulmonary:     Effort: No respiratory distress.  Abdominal:     General: There is no distension.  Musculoskeletal:        General: Normal range of motion.     Cervical back: Normal range of motion.     Comments: Hematoma to the left upper arm  Skin:    General: Skin is  warm.  Neurological:     Mental Status: She is alert. Mental status is at baseline.     IMPRESSION / MDM / ASSESSMENT AND PLAN / ED COURSE  I reviewed the triage vital signs and the nursing notes.  Differential diagnosis including Mnire's disease, BPPV, dissection, central vertigo, CVA  EKG  I, Corena Herter, the attending physician, personally viewed and interpreted this ECG.   Rate: Normal  Rhythm: Normal sinus  Axis: Normal  Intervals: Normal  ST&T Change: None  No tachycardic or bradycardic dysrhythmias while on cardiac telemetry.  RADIOLOGY I independently reviewed imaging, my interpretation of imaging: CT scan of the head -no signs of intracranial hemorrhage or infarction  Refused x-ray imaging to the left upper extremity  LABS (all labs ordered are listed, but only abnormal results are displayed) Labs interpreted as -    Labs Reviewed  CBC - Abnormal; Notable for the following components:      Result Value   WBC 2.4 (*)    Platelets  113 (*)    All other components within normal limits  BASIC METABOLIC PANEL - Abnormal; Notable for the following components:   Glucose, Bld 117 (*)    All other components within normal limits  MAGNESIUM     MDM   Able to ambulate in the emergency department without significant episodes of vertigo or gait instability.  Concern for peripheral vertigo, concern for Mnire's disease given her history of similar.  Will start the patient on steroids.  Discussed Epley's maneuver.  Discussed meclizine as needed and ENT follow-up.  CT scan of the head without signs of intracranial hemorrhage or infarction.  Low suspicion for CVA given intermittent episodes of symptoms that are severe.  Normal TMs bilaterally with no signs of cerumen impaction.   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  Followed up with primary care physician last week and  had   PROCEDURES:  Critical Care performed: No  Procedures  Patient's presentation is most consistent with acute illness / injury with system symptoms.   MEDICATIONS ORDERED IN ED: Medications - No data to display  FINAL CLINICAL IMPRESSION(S) / ED DIAGNOSES   Final diagnoses:  Injury of head, initial encounter  Vertigo     Rx / DC Orders   ED Discharge Orders          Ordered    predniSONE (DELTASONE) 10 MG tablet  Daily        03/22/23 1043             Note:  This document was prepared using Dragon voice recognition software and may include unintentional dictation errors.   Corena Herter, MD 03/22/23 1538

## 2023-03-22 NOTE — Discharge Instructions (Addendum)
You are seen in the emergency department for vertigo.  You had a CT scan of your head that was normal.  Your lab work did not show any findings abnormal from your known history of low white blood cell count.  You are started on a steroid course for your vertigo.  It is important that you follow-up closely with the ear nose and throat specialist as you may need further workup and vestibular rehab.  Follow-up closely with your primary care physician.  Do not drive while you are having symptoms of vertigo.  Use your walker at all times  Prednisone -you are given a prescription for a steroid.  It is important that you take this medication with food.  This medication can cause an upset stomach.

## 2023-03-22 NOTE — ED Triage Notes (Signed)
Presents via EMS s/p fall  States she has some issues with vertigo  Larey Seat this am   hitting head   No LOC  and hit left arm

## 2023-03-25 DIAGNOSIS — G44209 Tension-type headache, unspecified, not intractable: Secondary | ICD-10-CM | POA: Diagnosis not present

## 2023-03-25 DIAGNOSIS — R42 Dizziness and giddiness: Secondary | ICD-10-CM | POA: Diagnosis not present

## 2023-04-01 DIAGNOSIS — H8111 Benign paroxysmal vertigo, right ear: Secondary | ICD-10-CM | POA: Diagnosis not present

## 2023-04-01 DIAGNOSIS — H90A21 Sensorineural hearing loss, unilateral, right ear, with restricted hearing on the contralateral side: Secondary | ICD-10-CM | POA: Diagnosis not present

## 2023-04-09 DIAGNOSIS — H8113 Benign paroxysmal vertigo, bilateral: Secondary | ICD-10-CM | POA: Diagnosis not present

## 2023-04-09 DIAGNOSIS — R6889 Other general symptoms and signs: Secondary | ICD-10-CM | POA: Diagnosis not present

## 2023-04-15 DIAGNOSIS — H8113 Benign paroxysmal vertigo, bilateral: Secondary | ICD-10-CM | POA: Diagnosis not present

## 2023-04-15 DIAGNOSIS — R6889 Other general symptoms and signs: Secondary | ICD-10-CM | POA: Diagnosis not present

## 2023-04-15 DIAGNOSIS — R2681 Unsteadiness on feet: Secondary | ICD-10-CM | POA: Diagnosis not present

## 2023-04-15 DIAGNOSIS — R296 Repeated falls: Secondary | ICD-10-CM | POA: Diagnosis not present

## 2023-04-15 DIAGNOSIS — G4486 Cervicogenic headache: Secondary | ICD-10-CM | POA: Diagnosis not present

## 2023-04-19 DIAGNOSIS — G4486 Cervicogenic headache: Secondary | ICD-10-CM | POA: Diagnosis not present

## 2023-04-19 DIAGNOSIS — R2681 Unsteadiness on feet: Secondary | ICD-10-CM | POA: Diagnosis not present

## 2023-04-19 DIAGNOSIS — R296 Repeated falls: Secondary | ICD-10-CM | POA: Diagnosis not present

## 2023-04-19 DIAGNOSIS — R6889 Other general symptoms and signs: Secondary | ICD-10-CM | POA: Diagnosis not present

## 2023-04-19 DIAGNOSIS — H8113 Benign paroxysmal vertigo, bilateral: Secondary | ICD-10-CM | POA: Diagnosis not present

## 2023-04-25 DIAGNOSIS — G4486 Cervicogenic headache: Secondary | ICD-10-CM | POA: Diagnosis not present

## 2023-04-25 DIAGNOSIS — R2681 Unsteadiness on feet: Secondary | ICD-10-CM | POA: Diagnosis not present

## 2023-04-25 DIAGNOSIS — H8113 Benign paroxysmal vertigo, bilateral: Secondary | ICD-10-CM | POA: Diagnosis not present

## 2023-04-25 DIAGNOSIS — R296 Repeated falls: Secondary | ICD-10-CM | POA: Diagnosis not present

## 2023-04-25 DIAGNOSIS — R6889 Other general symptoms and signs: Secondary | ICD-10-CM | POA: Diagnosis not present

## 2023-05-02 DIAGNOSIS — R2681 Unsteadiness on feet: Secondary | ICD-10-CM | POA: Diagnosis not present

## 2023-05-02 DIAGNOSIS — R296 Repeated falls: Secondary | ICD-10-CM | POA: Diagnosis not present

## 2023-05-02 DIAGNOSIS — R6889 Other general symptoms and signs: Secondary | ICD-10-CM | POA: Diagnosis not present

## 2023-05-02 DIAGNOSIS — G4486 Cervicogenic headache: Secondary | ICD-10-CM | POA: Diagnosis not present

## 2023-05-02 DIAGNOSIS — H8113 Benign paroxysmal vertigo, bilateral: Secondary | ICD-10-CM | POA: Diagnosis not present

## 2023-05-07 ENCOUNTER — Inpatient Hospital Stay: Payer: Medicare HMO | Attending: Oncology

## 2023-05-07 DIAGNOSIS — R161 Splenomegaly, not elsewhere classified: Secondary | ICD-10-CM

## 2023-05-07 DIAGNOSIS — D709 Neutropenia, unspecified: Secondary | ICD-10-CM | POA: Insufficient documentation

## 2023-05-07 DIAGNOSIS — D696 Thrombocytopenia, unspecified: Secondary | ICD-10-CM

## 2023-05-07 LAB — CBC WITH DIFFERENTIAL/PLATELET
Abs Immature Granulocytes: 0 10*3/uL (ref 0.00–0.07)
Basophils Absolute: 0 10*3/uL (ref 0.0–0.1)
Basophils Relative: 1 %
Eosinophils Absolute: 0 10*3/uL (ref 0.0–0.5)
Eosinophils Relative: 2 %
HCT: 40.2 % (ref 36.0–46.0)
Hemoglobin: 13.6 g/dL (ref 12.0–15.0)
Immature Granulocytes: 0 %
Lymphocytes Relative: 31 %
Lymphs Abs: 0.8 10*3/uL (ref 0.7–4.0)
MCH: 30 pg (ref 26.0–34.0)
MCHC: 33.8 g/dL (ref 30.0–36.0)
MCV: 88.5 fL (ref 80.0–100.0)
Monocytes Absolute: 0.1 10*3/uL (ref 0.1–1.0)
Monocytes Relative: 5 %
Neutro Abs: 1.6 10*3/uL — ABNORMAL LOW (ref 1.7–7.7)
Neutrophils Relative %: 61 %
Platelets: 105 10*3/uL — ABNORMAL LOW (ref 150–400)
RBC: 4.54 MIL/uL (ref 3.87–5.11)
RDW: 12.8 % (ref 11.5–15.5)
WBC: 2.5 10*3/uL — ABNORMAL LOW (ref 4.0–10.5)
nRBC: 0 % (ref 0.0–0.2)

## 2023-05-07 LAB — COMPREHENSIVE METABOLIC PANEL
ALT: 14 U/L (ref 0–44)
AST: 18 U/L (ref 15–41)
Albumin: 4.2 g/dL (ref 3.5–5.0)
Alkaline Phosphatase: 69 U/L (ref 38–126)
Anion gap: 8 (ref 5–15)
BUN: 13 mg/dL (ref 8–23)
CO2: 24 mmol/L (ref 22–32)
Calcium: 9.1 mg/dL (ref 8.9–10.3)
Chloride: 104 mmol/L (ref 98–111)
Creatinine, Ser: 0.91 mg/dL (ref 0.44–1.00)
GFR, Estimated: >60 mL/min (ref >60)
Glucose, Bld: 91 mg/dL (ref 70–99)
Potassium: 4.5 mmol/L (ref 3.5–5.1)
Sodium: 136 mmol/L (ref 135–145)
Total Bilirubin: 0.6 mg/dL (ref 0.3–1.2)
Total Protein: 6.9 g/dL (ref 6.5–8.1)

## 2023-05-08 DIAGNOSIS — H8113 Benign paroxysmal vertigo, bilateral: Secondary | ICD-10-CM | POA: Diagnosis not present

## 2023-05-08 DIAGNOSIS — R296 Repeated falls: Secondary | ICD-10-CM | POA: Diagnosis not present

## 2023-05-08 DIAGNOSIS — G4486 Cervicogenic headache: Secondary | ICD-10-CM | POA: Diagnosis not present

## 2023-05-08 DIAGNOSIS — R2681 Unsteadiness on feet: Secondary | ICD-10-CM | POA: Diagnosis not present

## 2023-05-08 DIAGNOSIS — R6889 Other general symptoms and signs: Secondary | ICD-10-CM | POA: Diagnosis not present

## 2023-05-21 DIAGNOSIS — H8109 Meniere's disease, unspecified ear: Secondary | ICD-10-CM | POA: Diagnosis not present

## 2023-05-27 DIAGNOSIS — R6889 Other general symptoms and signs: Secondary | ICD-10-CM | POA: Diagnosis not present

## 2023-05-27 DIAGNOSIS — R296 Repeated falls: Secondary | ICD-10-CM | POA: Diagnosis not present

## 2023-05-27 DIAGNOSIS — R2681 Unsteadiness on feet: Secondary | ICD-10-CM | POA: Diagnosis not present

## 2023-05-27 DIAGNOSIS — H8113 Benign paroxysmal vertigo, bilateral: Secondary | ICD-10-CM | POA: Diagnosis not present

## 2023-05-27 DIAGNOSIS — G4486 Cervicogenic headache: Secondary | ICD-10-CM | POA: Diagnosis not present

## 2023-05-31 ENCOUNTER — Other Ambulatory Visit: Payer: Self-pay | Admitting: Student

## 2023-05-31 DIAGNOSIS — R519 Headache, unspecified: Secondary | ICD-10-CM | POA: Diagnosis not present

## 2023-05-31 DIAGNOSIS — R42 Dizziness and giddiness: Secondary | ICD-10-CM

## 2023-05-31 DIAGNOSIS — R2689 Other abnormalities of gait and mobility: Secondary | ICD-10-CM | POA: Diagnosis not present

## 2023-06-10 DIAGNOSIS — R6889 Other general symptoms and signs: Secondary | ICD-10-CM | POA: Diagnosis not present

## 2023-06-10 DIAGNOSIS — E7800 Pure hypercholesterolemia, unspecified: Secondary | ICD-10-CM | POA: Diagnosis not present

## 2023-06-10 DIAGNOSIS — I10 Essential (primary) hypertension: Secondary | ICD-10-CM | POA: Diagnosis not present

## 2023-06-10 DIAGNOSIS — K219 Gastro-esophageal reflux disease without esophagitis: Secondary | ICD-10-CM | POA: Diagnosis not present

## 2023-06-10 DIAGNOSIS — I480 Paroxysmal atrial fibrillation: Secondary | ICD-10-CM | POA: Diagnosis not present

## 2023-06-10 DIAGNOSIS — Z1331 Encounter for screening for depression: Secondary | ICD-10-CM | POA: Diagnosis not present

## 2023-06-10 DIAGNOSIS — R7302 Impaired glucose tolerance (oral): Secondary | ICD-10-CM | POA: Diagnosis not present

## 2023-06-10 DIAGNOSIS — Z0000 Encounter for general adult medical examination without abnormal findings: Secondary | ICD-10-CM | POA: Diagnosis not present

## 2023-06-10 DIAGNOSIS — I700 Atherosclerosis of aorta: Secondary | ICD-10-CM | POA: Diagnosis not present

## 2023-06-10 DIAGNOSIS — N3281 Overactive bladder: Secondary | ICD-10-CM | POA: Diagnosis not present

## 2023-06-10 DIAGNOSIS — M159 Polyosteoarthritis, unspecified: Secondary | ICD-10-CM | POA: Diagnosis not present

## 2023-06-11 DIAGNOSIS — R2681 Unsteadiness on feet: Secondary | ICD-10-CM | POA: Diagnosis not present

## 2023-06-11 DIAGNOSIS — R296 Repeated falls: Secondary | ICD-10-CM | POA: Diagnosis not present

## 2023-06-11 DIAGNOSIS — G4486 Cervicogenic headache: Secondary | ICD-10-CM | POA: Diagnosis not present

## 2023-06-11 DIAGNOSIS — H8113 Benign paroxysmal vertigo, bilateral: Secondary | ICD-10-CM | POA: Diagnosis not present

## 2023-06-13 ENCOUNTER — Encounter: Payer: Self-pay | Admitting: Student

## 2023-06-21 ENCOUNTER — Ambulatory Visit
Admission: RE | Admit: 2023-06-21 | Discharge: 2023-06-21 | Disposition: A | Discharge status: Home / Self Care | Payer: Medicare HMO | Source: Ambulatory Visit | Attending: Student

## 2023-06-21 DIAGNOSIS — R42 Dizziness and giddiness: Secondary | ICD-10-CM

## 2023-07-16 DIAGNOSIS — Z4689 Encounter for fitting and adjustment of other specified devices: Secondary | ICD-10-CM | POA: Diagnosis not present

## 2023-07-16 DIAGNOSIS — N898 Other specified noninflammatory disorders of vagina: Secondary | ICD-10-CM | POA: Diagnosis not present

## 2023-07-29 DIAGNOSIS — R2689 Other abnormalities of gait and mobility: Secondary | ICD-10-CM | POA: Diagnosis not present

## 2023-07-29 DIAGNOSIS — R519 Headache, unspecified: Secondary | ICD-10-CM | POA: Diagnosis not present

## 2023-07-29 DIAGNOSIS — R42 Dizziness and giddiness: Secondary | ICD-10-CM | POA: Diagnosis not present

## 2023-08-06 ENCOUNTER — Other Ambulatory Visit: Payer: Self-pay | Admitting: *Deleted

## 2023-08-06 DIAGNOSIS — D709 Neutropenia, unspecified: Secondary | ICD-10-CM

## 2023-08-06 DIAGNOSIS — D696 Thrombocytopenia, unspecified: Secondary | ICD-10-CM

## 2023-08-07 ENCOUNTER — Inpatient Hospital Stay: Payer: Medicare HMO | Attending: Oncology

## 2023-08-07 ENCOUNTER — Other Ambulatory Visit: Payer: Self-pay | Admitting: *Deleted

## 2023-08-07 ENCOUNTER — Inpatient Hospital Stay: Payer: Medicare HMO | Admitting: Oncology

## 2023-08-07 ENCOUNTER — Encounter: Payer: Self-pay | Admitting: Oncology

## 2023-08-07 VITALS — BP 140/75 | HR 69 | Temp 98.5°F | Resp 16 | Ht <= 58 in | Wt 150.9 lb

## 2023-08-07 DIAGNOSIS — Z8052 Family history of malignant neoplasm of bladder: Secondary | ICD-10-CM | POA: Diagnosis not present

## 2023-08-07 DIAGNOSIS — R161 Splenomegaly, not elsewhere classified: Secondary | ICD-10-CM

## 2023-08-07 DIAGNOSIS — D696 Thrombocytopenia, unspecified: Secondary | ICD-10-CM | POA: Insufficient documentation

## 2023-08-07 DIAGNOSIS — Z808 Family history of malignant neoplasm of other organs or systems: Secondary | ICD-10-CM | POA: Diagnosis not present

## 2023-08-07 DIAGNOSIS — D709 Neutropenia, unspecified: Secondary | ICD-10-CM | POA: Insufficient documentation

## 2023-08-07 LAB — CBC WITH DIFFERENTIAL (CANCER CENTER ONLY)
Abs Immature Granulocytes: 0.01 10*3/uL (ref 0.00–0.07)
Basophils Absolute: 0 10*3/uL (ref 0.0–0.1)
Basophils Relative: 1 %
Eosinophils Absolute: 0.1 10*3/uL (ref 0.0–0.5)
Eosinophils Relative: 3 %
HCT: 39.7 % (ref 36.0–46.0)
Hemoglobin: 13.6 g/dL (ref 12.0–15.0)
Immature Granulocytes: 1 %
Lymphocytes Relative: 35 %
Lymphs Abs: 0.8 10*3/uL (ref 0.7–4.0)
MCH: 30.2 pg (ref 26.0–34.0)
MCHC: 34.3 g/dL (ref 30.0–36.0)
MCV: 88.2 fL (ref 80.0–100.0)
Monocytes Absolute: 0.1 10*3/uL (ref 0.1–1.0)
Monocytes Relative: 5 %
Neutro Abs: 1.2 10*3/uL — ABNORMAL LOW (ref 1.7–7.7)
Neutrophils Relative %: 55 %
Platelet Count: 121 10*3/uL — ABNORMAL LOW (ref 150–400)
RBC: 4.5 MIL/uL (ref 3.87–5.11)
RDW: 12.8 % (ref 11.5–15.5)
WBC Count: 2.2 10*3/uL — ABNORMAL LOW (ref 4.0–10.5)
nRBC: 0 % (ref 0.0–0.2)

## 2023-08-07 LAB — CMP (CANCER CENTER ONLY)
ALT: 14 U/L (ref 0–44)
AST: 19 U/L (ref 15–41)
Albumin: 4.1 g/dL (ref 3.5–5.0)
Alkaline Phosphatase: 69 U/L (ref 38–126)
Anion gap: 7 (ref 5–15)
BUN: 13 mg/dL (ref 8–23)
CO2: 25 mmol/L (ref 22–32)
Calcium: 9.3 mg/dL (ref 8.9–10.3)
Chloride: 103 mmol/L (ref 98–111)
Creatinine: 0.93 mg/dL (ref 0.44–1.00)
GFR, Estimated: >60 mL/min (ref >60)
Glucose, Bld: 101 mg/dL — ABNORMAL HIGH (ref 70–99)
Potassium: 4.3 mmol/L (ref 3.5–5.1)
Sodium: 135 mmol/L (ref 135–145)
Total Bilirubin: 0.7 mg/dL (ref <1.2)
Total Protein: 7 g/dL (ref 6.5–8.1)

## 2023-08-07 NOTE — Progress Notes (Signed)
Hematology/Oncology Consult note [facility]  Telephone:([phone] Fax:([phone]  Patient Care Team: Marina Goodell, MD as PCP - General (Family Medicine) Creig Hines, MD as Consulting Physician (Oncology)   Name of the patient: Kaitlin Wilson  [id number]  1951/08/15   Date of visit: 08/07/23  Diagnosis-neutropenia and thrombocytopenia of unclear etiology  Chief complaint/ Reason for visit-routine follow-up of neutropenia and thrombocytopenia  Heme/Onc history: patient is a 72 year old female with a past medical history significant for hypertension hyperlipidemia GERD and arthritis referred for leukopenia.  Most recent CBC from 05/19/2021 showed a white count of 2.2, H&H of 12.5/36.3 with a platelet count of 146.  Differential mainly showed neutropenia with an ANC of 1.3And lymphopenia.  2 years ago in November 2020 patient had a normal white count of 5.1 with an ANC of 3.8.  In June 2022 her white count was 2.8 with an ANC of 1.9.  Patient was found to have B12 deficiency for which she isTaking oral B12.  HIV and hepatitis C testing negative.  Peripheral flow cytometry did not reveal any immunophenotypic abnormality.  ANA comprehensive panel negative.   Interval history-patient feels well overall and has not had any recurrent infections.  She did have some symptoms of vertigo over the last few weeks for which she was seen by ENT.  Denies any significant joint pain or swelling  ECOG PS- 1 Pain scale- 0   Review of systems- Review of Systems  Constitutional:  Negative for chills, fever, malaise/fatigue and weight loss.  HENT:  Negative for congestion, ear discharge and nosebleeds.   Eyes:  Negative for blurred vision.  Respiratory:  Negative for cough, hemoptysis, sputum production, shortness of breath and wheezing.   Cardiovascular:  Negative for chest pain, palpitations, orthopnea and claudication.  Gastrointestinal:  Negative for abdominal  pain, blood in stool, constipation, diarrhea, heartburn, melena, nausea and vomiting.  Genitourinary:  Negative for dysuria, flank pain, frequency, hematuria and urgency.  Musculoskeletal:  Negative for back pain, joint pain and myalgias.  Skin:  Negative for rash.  Neurological:  Negative for dizziness, tingling, focal weakness, seizures, weakness and headaches.  Endo/Heme/Allergies:  Does not bruise/bleed easily.  Psychiatric/Behavioral:  Negative for depression and suicidal ideas. The patient does not have insomnia.       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   Procedure: TOTAL HIP ARTHROPLASTY ANTERIOR APPROACH;  Surgeon: Kennedy Bucker, MD;  Location: [facility] ORS;  Service: Orthopedics;  Laterality: Right;    Social History   Socioeconomic History   Marital status: Single    Spouse name: Not on file   Number of children: Not on file   Years of education: Not on file   Highest education level: Not on file  Occupational History   Not on file  Tobacco Use   Smoking status: Never   Smokeless  tobacco: Never  Vaping Use   Vaping status: Never Used  Substance and Sexual Activity   Alcohol use: No   Drug use: No   Sexual activity: Not Currently  Other Topics Concern   Not on file  Social History Narrative   Not on file   Social Determinants of Health   Financial Resource Strain: Not on file  Food  Insecurity: Not on file  Transportation Needs: Not on file  Physical Activity: Not on file  Stress: Not on file  Social Connections: Not on file  Intimate Partner Violence: Not on file    Family History  Problem Relation Age of Onset   Brain cancer Mother    Hypertension Father    Stroke Father    Kidney cancer Brother    Heart attack Brother    Bladder Cancer Neg Hx      Current Outpatient Medications:    Cholecalciferol (VITAMIN D) 2000 units tablet, Take 2,000 Units by mouth daily., Disp: , Rfl:    losartan (COZAAR) 25 MG tablet, Take 12.5 mg by mouth daily., Disp: , Rfl:    trolamine salicylate (ASPERCREME) 10 % cream, Apply 1 application topically as needed for muscle pain., Disp: , Rfl:    vitamin B-12 (CYANOCOBALAMIN) 1000 MCG tablet, Take 1,000 mcg by mouth daily., Disp: , Rfl:    atorvastatin (LIPITOR) 10 MG tablet, Take 1 tablet by mouth daily. (Patient not taking: Reported on 08/07/2023), Disp: , Rfl:    meclizine (ANTIVERT) 12.5 MG tablet, Take 12.5 mg by mouth 3 (three) times daily as needed., Disp: , Rfl:    traMADol (ULTRAM) 50 MG tablet, Take 1 tablet (50 mg total) by mouth every 6 (six) hours as needed. (Patient not taking: Reported on 08/07/2023), Disp: 30 tablet, Rfl: 0  Current Facility-Administered Medications:    betamethasone acetate-betamethasone sodium phosphate (CELESTONE) injection 3 mg, 3 mg, Intramuscular, Once, Felecia Shelling, DPM  Physical exam:  Vitals:   08/07/23 1029  BP: (!) 140/75  Pulse: 69  Resp: 16  Temp: 98.5 F (36.9 C)  TempSrc: Tympanic  SpO2: 100%  Weight: 150 lb 14.4 oz (68.4 kg)  Height: 4\' 9"  (1.448 m)   Physical Exam Cardiovascular:     Rate and Rhythm: Normal rate and regular rhythm.     Heart sounds: Normal heart sounds.  Pulmonary:     Effort: Pulmonary effort is normal.     Breath sounds: Normal breath sounds.  Abdominal:     General: Bowel sounds are normal.     Palpations: Abdomen is soft.     Comments: Splenic  tip is palpable  Lymphadenopathy:     Comments: No palpable cervical, supraclavicular, axillary or inguinal adenopathy    Skin:    General: Skin is warm and dry.  Neurological:     Mental Status: She is alert and oriented to person, place, and time.         Latest Ref Rng & Units 08/07/2023   10:18 AM  CMP  Glucose 70 - 99 mg/dL 426   BUN 8 - 23 mg/dL 13   Creatinine 8.34 - 1.00 mg/dL 1.96   Sodium 222 - 979 mmol/L 135   Potassium 3.5 - 5.1 mmol/L 4.3   Chloride 98 - 111 mmol/L 103   CO2 22 - 32 mmol/L 25   Calcium 8.9 - 10.3 mg/dL 9.3   Total Protein 6.5 - 8.1 g/dL 7.0   Total Bilirubin <8.9 mg/dL 0.7   Alkaline Phos 38 -  126 U/L 69   AST 15 - 41 U/L 19   ALT 0 - 44 U/L 14       Latest Ref Rng & Units 08/07/2023   10:18 AM  CBC  WBC 4.0 - 10.5 K/uL 2.2   Hemoglobin 12.0 - 15.0 g/dL 21.3   Hematocrit 08.6 - 46.0 % 39.7   Platelets 150 - 400 K/uL 121     No images are attached to the encounter.  No results found.   Assessment and plan- Patient is a 72 y.o. female here for routine follow-up of neutropenia and thrombocytopenia   Patient's neutrophil count has been fluctuating between 1.2-1.6 over the last 1 year without a clear rising or downward trend.  Hemoglobin is normal and she has had chronic thrombocytopenia with a platelet count that fluctuates between 100-1 30 since November 2023.  Peripheral flow cytometry did not reveal any immunophenotypic abnormality.  ANA comprehensive panel unremarkable.  She did have evidence of splenomegaly on her ultrasound spleen that was done in November 2023.  Clinically her splenic tip is palpable but patient does not have significant splenomegaly on physical exam.  Patient does not wish to get an ultrasound spleen at this time.  She also does not wish to proceed with a bone marrow biopsy.  I will continue to monitor her CBC with differential in 3 and 6 months and see her back in 6 months.  I will also check rheumatoid factor in 3  months.  Clinically patient does not have any signs and symptoms of rheumatoid arthritis.  However both splenomegaly and neutropenia can be seen in Felty syndrome in the setting of rheumatoid arthritis.  Neutropenia and thrombocytopenia can also be seen in conditions like LGL leukemia which ideally need bone marrow biopsy for further evaluation   Visit Diagnosis 1. Thrombocytopenia (HCC)   2. Splenomegaly      Dr. Owens Shark, MD, MPH [facility] at [facility] [phone] 08/07/2023 1:14 PM

## 2023-08-07 NOTE — Progress Notes (Signed)
Pt does not want a us/ of spleen

## 2023-08-13 DIAGNOSIS — H8103 Meniere's disease, bilateral: Secondary | ICD-10-CM | POA: Diagnosis not present

## 2023-08-13 DIAGNOSIS — H90A21 Sensorineural hearing loss, unilateral, right ear, with restricted hearing on the contralateral side: Secondary | ICD-10-CM | POA: Diagnosis not present

## 2023-08-13 DIAGNOSIS — H6123 Impacted cerumen, bilateral: Secondary | ICD-10-CM | POA: Diagnosis not present

## 2023-08-21 ENCOUNTER — Other Ambulatory Visit: Payer: Medicare HMO

## 2023-09-03 DIAGNOSIS — R42 Dizziness and giddiness: Secondary | ICD-10-CM | POA: Diagnosis not present

## 2023-10-28 DIAGNOSIS — L02415 Cutaneous abscess of right lower limb: Secondary | ICD-10-CM | POA: Diagnosis not present

## 2023-11-06 DIAGNOSIS — L929 Granulomatous disorder of the skin and subcutaneous tissue, unspecified: Secondary | ICD-10-CM | POA: Diagnosis not present

## 2023-11-06 DIAGNOSIS — Z4689 Encounter for fitting and adjustment of other specified devices: Secondary | ICD-10-CM | POA: Diagnosis not present

## 2023-11-06 DIAGNOSIS — N8111 Cystocele, midline: Secondary | ICD-10-CM | POA: Diagnosis not present

## 2023-11-06 DIAGNOSIS — N898 Other specified noninflammatory disorders of vagina: Secondary | ICD-10-CM | POA: Diagnosis not present

## 2023-11-07 ENCOUNTER — Other Ambulatory Visit: Payer: Medicare HMO

## 2023-11-08 ENCOUNTER — Inpatient Hospital Stay: Payer: Medicare HMO | Attending: Oncology

## 2023-11-08 DIAGNOSIS — R161 Splenomegaly, not elsewhere classified: Secondary | ICD-10-CM

## 2023-11-08 DIAGNOSIS — L720 Epidermal cyst: Secondary | ICD-10-CM | POA: Diagnosis not present

## 2023-11-08 DIAGNOSIS — D696 Thrombocytopenia, unspecified: Secondary | ICD-10-CM

## 2023-11-08 DIAGNOSIS — D708 Other neutropenia: Secondary | ICD-10-CM | POA: Diagnosis not present

## 2023-11-08 LAB — CBC WITH DIFFERENTIAL/PLATELET
Abs Immature Granulocytes: 0 10*3/uL (ref 0.00–0.07)
Basophils Absolute: 0 10*3/uL (ref 0.0–0.1)
Basophils Relative: 1 %
Eosinophils Absolute: 0.1 10*3/uL (ref 0.0–0.5)
Eosinophils Relative: 3 %
HCT: 40.5 % (ref 36.0–46.0)
Hemoglobin: 13.3 g/dL (ref 12.0–15.0)
Immature Granulocytes: 0 %
Lymphocytes Relative: 35 %
Lymphs Abs: 0.7 10*3/uL (ref 0.7–4.0)
MCH: 29.4 pg (ref 26.0–34.0)
MCHC: 32.8 g/dL (ref 30.0–36.0)
MCV: 89.4 fL (ref 80.0–100.0)
Monocytes Absolute: 0.1 10*3/uL (ref 0.1–1.0)
Monocytes Relative: 5 %
Neutro Abs: 1.1 10*3/uL — ABNORMAL LOW (ref 1.7–7.7)
Neutrophils Relative %: 56 %
Platelets: 102 10*3/uL — ABNORMAL LOW (ref 150–400)
RBC: 4.53 MIL/uL (ref 3.87–5.11)
RDW: 12.8 % (ref 11.5–15.5)
WBC: 2 10*3/uL — ABNORMAL LOW (ref 4.0–10.5)
nRBC: 0 % (ref 0.0–0.2)

## 2023-11-10 LAB — RHEUMATOID FACTOR: Rheumatoid fact SerPl-aCnc: <10 [IU]/mL (ref <14.0)

## 2023-11-13 DIAGNOSIS — E7800 Pure hypercholesterolemia, unspecified: Secondary | ICD-10-CM | POA: Diagnosis not present

## 2023-11-13 DIAGNOSIS — N3281 Overactive bladder: Secondary | ICD-10-CM | POA: Diagnosis not present

## 2023-11-13 DIAGNOSIS — I10 Essential (primary) hypertension: Secondary | ICD-10-CM | POA: Diagnosis not present

## 2023-11-13 DIAGNOSIS — K219 Gastro-esophageal reflux disease without esophagitis: Secondary | ICD-10-CM | POA: Diagnosis not present

## 2023-11-13 DIAGNOSIS — R7302 Impaired glucose tolerance (oral): Secondary | ICD-10-CM | POA: Diagnosis not present

## 2023-11-13 DIAGNOSIS — R42 Dizziness and giddiness: Secondary | ICD-10-CM | POA: Diagnosis not present

## 2023-11-13 DIAGNOSIS — M159 Polyosteoarthritis, unspecified: Secondary | ICD-10-CM | POA: Diagnosis not present

## 2023-11-13 DIAGNOSIS — I700 Atherosclerosis of aorta: Secondary | ICD-10-CM | POA: Diagnosis not present

## 2023-11-13 DIAGNOSIS — I480 Paroxysmal atrial fibrillation: Secondary | ICD-10-CM | POA: Diagnosis not present

## 2023-11-18 DIAGNOSIS — I10 Essential (primary) hypertension: Secondary | ICD-10-CM | POA: Diagnosis not present

## 2023-11-18 DIAGNOSIS — E7800 Pure hypercholesterolemia, unspecified: Secondary | ICD-10-CM | POA: Diagnosis not present

## 2023-11-18 DIAGNOSIS — R7302 Impaired glucose tolerance (oral): Secondary | ICD-10-CM | POA: Diagnosis not present

## 2023-11-18 DIAGNOSIS — S0990XA Unspecified injury of head, initial encounter: Secondary | ICD-10-CM | POA: Diagnosis not present

## 2023-11-18 DIAGNOSIS — I480 Paroxysmal atrial fibrillation: Secondary | ICD-10-CM | POA: Diagnosis not present

## 2023-11-18 DIAGNOSIS — I700 Atherosclerosis of aorta: Secondary | ICD-10-CM | POA: Diagnosis not present

## 2023-11-18 DIAGNOSIS — M159 Polyosteoarthritis, unspecified: Secondary | ICD-10-CM | POA: Diagnosis not present

## 2023-12-04 DIAGNOSIS — D485 Neoplasm of uncertain behavior of skin: Secondary | ICD-10-CM | POA: Diagnosis not present

## 2023-12-04 DIAGNOSIS — C44722 Squamous cell carcinoma of skin of right lower limb, including hip: Secondary | ICD-10-CM | POA: Diagnosis not present

## 2023-12-11 DIAGNOSIS — Z4689 Encounter for fitting and adjustment of other specified devices: Secondary | ICD-10-CM | POA: Diagnosis not present

## 2023-12-11 DIAGNOSIS — N898 Other specified noninflammatory disorders of vagina: Secondary | ICD-10-CM | POA: Diagnosis not present

## 2024-01-13 DIAGNOSIS — C44722 Squamous cell carcinoma of skin of right lower limb, including hip: Secondary | ICD-10-CM | POA: Diagnosis not present

## 2024-01-20 DIAGNOSIS — Z4689 Encounter for fitting and adjustment of other specified devices: Secondary | ICD-10-CM | POA: Diagnosis not present

## 2024-01-20 DIAGNOSIS — N898 Other specified noninflammatory disorders of vagina: Secondary | ICD-10-CM | POA: Diagnosis not present

## 2024-01-20 DIAGNOSIS — N814 Uterovaginal prolapse, unspecified: Secondary | ICD-10-CM | POA: Diagnosis not present

## 2024-01-21 DIAGNOSIS — L0390 Cellulitis, unspecified: Secondary | ICD-10-CM | POA: Diagnosis not present

## 2024-01-24 DIAGNOSIS — K219 Gastro-esophageal reflux disease without esophagitis: Secondary | ICD-10-CM | POA: Diagnosis not present

## 2024-01-24 DIAGNOSIS — J069 Acute upper respiratory infection, unspecified: Secondary | ICD-10-CM | POA: Diagnosis not present

## 2024-02-04 ENCOUNTER — Inpatient Hospital Stay: Payer: Medicare HMO | Admitting: Oncology

## 2024-02-04 ENCOUNTER — Other Ambulatory Visit: Payer: Self-pay

## 2024-02-04 ENCOUNTER — Encounter: Payer: Self-pay | Admitting: Oncology

## 2024-02-04 ENCOUNTER — Inpatient Hospital Stay: Payer: Medicare HMO | Attending: Oncology

## 2024-02-04 DIAGNOSIS — D709 Neutropenia, unspecified: Secondary | ICD-10-CM | POA: Diagnosis not present

## 2024-02-04 DIAGNOSIS — D708 Other neutropenia: Secondary | ICD-10-CM | POA: Diagnosis not present

## 2024-02-04 DIAGNOSIS — D696 Thrombocytopenia, unspecified: Secondary | ICD-10-CM

## 2024-02-04 DIAGNOSIS — Z808 Family history of malignant neoplasm of other organs or systems: Secondary | ICD-10-CM | POA: Insufficient documentation

## 2024-02-04 DIAGNOSIS — R161 Splenomegaly, not elsewhere classified: Secondary | ICD-10-CM

## 2024-02-04 DIAGNOSIS — Z8052 Family history of malignant neoplasm of bladder: Secondary | ICD-10-CM | POA: Diagnosis not present

## 2024-02-04 LAB — CBC WITH DIFFERENTIAL/PLATELET
Abs Immature Granulocytes: 0 10*3/uL (ref 0.00–0.07)
Basophils Absolute: 0 10*3/uL (ref 0.0–0.1)
Basophils Relative: 1 %
Eosinophils Absolute: 0 10*3/uL (ref 0.0–0.5)
Eosinophils Relative: 2 %
HCT: 37.5 % (ref 36.0–46.0)
Hemoglobin: 12.5 g/dL (ref 12.0–15.0)
Immature Granulocytes: 0 %
Lymphocytes Relative: 29 %
Lymphs Abs: 0.5 10*3/uL — ABNORMAL LOW (ref 0.7–4.0)
MCH: 29.3 pg (ref 26.0–34.0)
MCHC: 33.3 g/dL (ref 30.0–36.0)
MCV: 88 fL (ref 80.0–100.0)
Monocytes Absolute: 0.1 10*3/uL (ref 0.1–1.0)
Monocytes Relative: 4 %
Neutro Abs: 1.2 10*3/uL — ABNORMAL LOW (ref 1.7–7.7)
Neutrophils Relative %: 64 %
Platelets: 116 10*3/uL — ABNORMAL LOW (ref 150–400)
RBC: 4.26 MIL/uL (ref 3.87–5.11)
RDW: 13 % (ref 11.5–15.5)
WBC: 1.8 10*3/uL — ABNORMAL LOW (ref 4.0–10.5)
nRBC: 0 % (ref 0.0–0.2)

## 2024-02-04 NOTE — Progress Notes (Signed)
 Hematology/Oncology Consult note [facility]  Telephone:([phone] [phone]  Patient Care Team: Lorrie Rothman, MD as PCP - General (Family Medicine) Avonne Boettcher, MD as Consulting Physician (Oncology)   Name of the patient: Kaitlin Wilson  [id number]  March 02, 1951   Date of visit: 02/04/24  Diagnosis- neutropenia and thrombocytopenia of unclear etiology    Chief complaint/ Reason for visit- routine f/u of neutropenia and thrombocytopenia  Heme/Onc history: patient is a 73 year old female with a past medical history significant for hypertension hyperlipidemia GERD and arthritis referred for leukopenia.  Most recent CBC from 05/19/2021 showed a white count of 2.2, H&H of 12.5/36.3 with a platelet count of 146.  Differential mainly showed neutropenia with an ANC of 1.3And lymphopenia.  2 years ago in November 2020 patient had a normal white count of 5.1 with an ANC of 3.8.  In June 2022 her white count was 2.8 with an ANC of 1.9.  Patient was found to have B12 deficiency for which she isTaking oral B12.  HIV and hepatitis C testing negative.  Peripheral flow cytometry did not reveal any immunophenotypic abnormality. No monoclonal B cell population is detected. kappa:lambda ratio 0.6  There is no loss of, or aberrant expression of, the pan T cell antigens to suggest a neoplastic T cell process.  ANA comprehensive panel negative.  Rheumatoid factor negative  Interval history-patient overall feels well and has not had any recurrent infections or hospitalizations.  Appetite and weight have remained stable.  Denies any bleeding or bruising  ECOG PS- 1 Pain scale- 0   Review of systems- Review of Systems  Constitutional:  Negative for chills, fever, malaise/fatigue and weight loss.  HENT:  Negative for congestion, ear discharge and nosebleeds.   Eyes:  Negative for blurred vision.  Respiratory:  Negative for cough, hemoptysis, sputum production,  shortness of breath and wheezing.   Cardiovascular:  Negative for chest pain, palpitations, orthopnea and claudication.  Gastrointestinal:  Negative for abdominal pain, blood in stool, constipation, diarrhea, heartburn, melena, nausea and vomiting.  Genitourinary:  Negative for dysuria, flank pain, frequency, hematuria and urgency.  Musculoskeletal:  Negative for back pain, joint pain and myalgias.  Skin:  Negative for rash.  Neurological:  Negative for dizziness, tingling, focal weakness, seizures, weakness and headaches.  Endo/Heme/Allergies:  Does not bruise/bleed easily.  Psychiatric/Behavioral:  Negative for depression and suicidal ideas. The patient does not have insomnia.       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   Procedure: TOTAL HIP ARTHROPLASTY ANTERIOR APPROACH;  Surgeon: Molli Angelucci, MD;  Location: [facility] ORS;  Service: Orthopedics;  Laterality: Right;    Social History   Socioeconomic History   Marital status: Single    Spouse name: Not on file   Number of children: Not on file   Years of education: Not on file  Highest education level: Not on file  Occupational History   Not on file  Tobacco Use   Smoking status: Never   Smokeless tobacco: Never  Vaping Use   Vaping status: Never Used  Substance and Sexual Activity   Alcohol use: No   Drug use: No   Sexual activity: Not Currently   Other Topics Concern   Not on file  Social History Narrative   Not on file   Social Drivers of Health   Financial Resource Strain: Not on file  Food Insecurity: Not on file  Transportation Needs: Not on file  Physical Activity: Not on file  Stress: Not on file  Social Connections: Not on file  Intimate Partner Violence: Not on file    Family History  Problem Relation Age of Onset   Brain cancer Mother    Hypertension Father    Stroke Father    Kidney cancer Brother    Heart attack Brother    Bladder Cancer Neg Hx      Current Outpatient Medications:    Cholecalciferol  (VITAMIN D ) 2000 units tablet, Take 2,000 Units by mouth daily., Disp: , Rfl:    lidocaine -prilocaine (EMLA) cream, APPLY TOPICALLY ONCE FOR 1 DOSE AS NEEDED FOR GYN EXAM EVERY 3 MTHS, Disp: , Rfl:    losartan  (COZAAR ) 25 MG tablet, Take 12.5 mg by mouth daily., Disp: , Rfl:    meclizine (ANTIVERT) 12.5 MG tablet, Take 12.5 mg by mouth 3 (three) times daily as needed., Disp: , Rfl:    omeprazole (PRILOSEC) 20 MG capsule, Take 20 mg by mouth., Disp: , Rfl:    trolamine salicylate (ASPERCREME) 10 % cream, Apply 1 application topically as needed for muscle pain., Disp: , Rfl:    vitamin B-12 (CYANOCOBALAMIN ) 1000 MCG tablet, Take 1,000 mcg by mouth daily., Disp: , Rfl:    atorvastatin (LIPITOR) 10 MG tablet, Take 1 tablet by mouth daily. (Patient not taking: Reported on 02/04/2024), Disp: , Rfl:    traMADol  (ULTRAM ) 50 MG tablet, Take 1 tablet (50 mg total) by mouth every 6 (six) hours as needed. (Patient not taking: Reported on 02/04/2024), Disp: 30 tablet, Rfl: 0  Current Facility-Administered Medications:    betamethasone  acetate-betamethasone  sodium phosphate (CELESTONE ) injection 3 mg, 3 mg, Intramuscular, Once, Dot Gazella, DPM  Physical exam:  Vitals:   02/04/24 1013  BP: 136/68  Pulse: 66  Resp: 20  Temp: 98.6 F (37 C)  SpO2: 100%  Weight: 144 lb (65.3 kg)   Physical Exam Cardiovascular:      Rate and Rhythm: Normal rate and regular rhythm.     Heart sounds: Normal heart sounds.  Pulmonary:     Effort: Pulmonary effort is normal.     Breath sounds: Normal breath sounds.  Abdominal:     General: Bowel sounds are normal.     Palpations: Abdomen is soft.     Comments: Splenic tip is palpable  Lymphadenopathy:     Comments: No palpable cervical, supraclavicular, axillary or inguinal adenopathy    Skin:    General: Skin is warm and dry.  Neurological:     Mental Status: She is alert and oriented to person, place, and time.      I have personally reviewed labs listed below:    Latest Ref Rng & Units 08/07/2023   10:18 AM  CMP  Glucose 70 - 99 mg/dL 045   BUN 8 - 23 mg/dL 13   Creatinine 4.09 - 1.00 mg/dL 8.11   Sodium  135 - 145 mmol/L 135   Potassium 3.5 - 5.1 mmol/L 4.3   Chloride 98 - 111 mmol/L 103   CO2 22 - 32 mmol/L 25   Calcium 8.9 - 10.3 mg/dL 9.3   Total Protein 6.5 - 8.1 g/dL 7.0   Total Bilirubin <2.7 mg/dL 0.7   Alkaline Phos 38 - 126 U/L 69   AST 15 - 41 U/L 19   ALT 0 - 44 U/L 14       Latest Ref Rng & Units 02/04/2024   10:01 AM  CBC  WBC 4.0 - 10.5 K/uL 1.8   Hemoglobin 12.0 - 15.0 g/dL 06.2   Hematocrit 37.6 - 46.0 % 37.5   Platelets 150 - 400 K/uL 116      Assessment and plan- Patient is a 73 y.o. female here for routine follow-up of neutropenia and thrombocytopenia  Patient was first noticed to have neutropenia with an ANC that has remained between 1-1.6 since November 2023 without a clear rising or downward trend.  During the same.  Her platelets have fluctuated between [phone].  Hemoglobin has remained normal.  Patient is presently asymptomatic with no recurrent infections.  Peripheral blood workup for neutropenia including flow cytometry and nuclear variants of panel have been unremarkable.  We have discussed bone marrow biopsy in the past patient would like to hold off on that unless her neutrophil counts are less than 1.  CBC with  differential in 3 months in 6 months and I will see her back in 6 months  Patient noted to have splenomegaly of 19 cm back in November 2023.  I will plan to get a repeat ultrasound in 6 months prior to her visit with me   Visit Diagnosis 1. Splenomegaly   2. Thrombocytopenia (HCC)   3. Chronic neutropenia (HCC)      Dr. Seretha Dance, MD, MPH [facility] at [facility] [phone] 02/04/2024 12:53 PM

## 2024-02-11 DIAGNOSIS — H90A21 Sensorineural hearing loss, unilateral, right ear, with restricted hearing on the contralateral side: Secondary | ICD-10-CM | POA: Diagnosis not present

## 2024-02-11 DIAGNOSIS — R42 Dizziness and giddiness: Secondary | ICD-10-CM | POA: Diagnosis not present

## 2024-03-11 DIAGNOSIS — Z4689 Encounter for fitting and adjustment of other specified devices: Secondary | ICD-10-CM | POA: Diagnosis not present

## 2024-03-11 DIAGNOSIS — N898 Other specified noninflammatory disorders of vagina: Secondary | ICD-10-CM | POA: Diagnosis not present

## 2024-05-04 DIAGNOSIS — N8111 Cystocele, midline: Secondary | ICD-10-CM | POA: Diagnosis not present

## 2024-05-04 DIAGNOSIS — Z4689 Encounter for fitting and adjustment of other specified devices: Secondary | ICD-10-CM | POA: Diagnosis not present

## 2024-05-04 DIAGNOSIS — L929 Granulomatous disorder of the skin and subcutaneous tissue, unspecified: Secondary | ICD-10-CM | POA: Diagnosis not present

## 2024-05-04 DIAGNOSIS — N898 Other specified noninflammatory disorders of vagina: Secondary | ICD-10-CM | POA: Diagnosis not present

## 2024-05-06 ENCOUNTER — Inpatient Hospital Stay

## 2024-05-12 ENCOUNTER — Inpatient Hospital Stay: Attending: Oncology

## 2024-05-12 DIAGNOSIS — D696 Thrombocytopenia, unspecified: Secondary | ICD-10-CM | POA: Diagnosis not present

## 2024-05-12 DIAGNOSIS — D708 Other neutropenia: Secondary | ICD-10-CM | POA: Diagnosis not present

## 2024-05-12 DIAGNOSIS — D709 Neutropenia, unspecified: Secondary | ICD-10-CM | POA: Diagnosis not present

## 2024-05-12 DIAGNOSIS — R161 Splenomegaly, not elsewhere classified: Secondary | ICD-10-CM | POA: Insufficient documentation

## 2024-05-12 LAB — CBC WITH DIFFERENTIAL (CANCER CENTER ONLY)
Abs Immature Granulocytes: 0 K/uL (ref 0.00–0.07)
Basophils Absolute: 0 K/uL (ref 0.0–0.1)
Basophils Relative: 1 %
Eosinophils Absolute: 0 K/uL (ref 0.0–0.5)
Eosinophils Relative: 2 %
HCT: 41.2 % (ref 36.0–46.0)
Hemoglobin: 13.8 g/dL (ref 12.0–15.0)
Immature Granulocytes: 0 %
Lymphocytes Relative: 36 %
Lymphs Abs: 0.7 K/uL (ref 0.7–4.0)
MCH: 29.4 pg (ref 26.0–34.0)
MCHC: 33.5 g/dL (ref 30.0–36.0)
MCV: 87.7 fL (ref 80.0–100.0)
Monocytes Absolute: 0.1 K/uL (ref 0.1–1.0)
Monocytes Relative: 3 %
Neutro Abs: 1.1 K/uL — ABNORMAL LOW (ref 1.7–7.7)
Neutrophils Relative %: 58 %
Platelet Count: 108 K/uL — ABNORMAL LOW (ref 150–400)
RBC: 4.7 MIL/uL (ref 3.87–5.11)
RDW: 12.9 % (ref 11.5–15.5)
WBC Count: 1.9 K/uL — ABNORMAL LOW (ref 4.0–10.5)
nRBC: 0 % (ref 0.0–0.2)

## 2024-05-13 DIAGNOSIS — I700 Atherosclerosis of aorta: Secondary | ICD-10-CM | POA: Diagnosis not present

## 2024-05-13 DIAGNOSIS — R42 Dizziness and giddiness: Secondary | ICD-10-CM | POA: Diagnosis not present

## 2024-05-13 DIAGNOSIS — N3281 Overactive bladder: Secondary | ICD-10-CM | POA: Diagnosis not present

## 2024-05-13 DIAGNOSIS — Z1331 Encounter for screening for depression: Secondary | ICD-10-CM | POA: Diagnosis not present

## 2024-05-13 DIAGNOSIS — Z0000 Encounter for general adult medical examination without abnormal findings: Secondary | ICD-10-CM | POA: Diagnosis not present

## 2024-05-13 DIAGNOSIS — M159 Polyosteoarthritis, unspecified: Secondary | ICD-10-CM | POA: Diagnosis not present

## 2024-05-13 DIAGNOSIS — K219 Gastro-esophageal reflux disease without esophagitis: Secondary | ICD-10-CM | POA: Diagnosis not present

## 2024-05-13 DIAGNOSIS — E7800 Pure hypercholesterolemia, unspecified: Secondary | ICD-10-CM | POA: Diagnosis not present

## 2024-05-13 DIAGNOSIS — R7302 Impaired glucose tolerance (oral): Secondary | ICD-10-CM | POA: Diagnosis not present

## 2024-05-13 DIAGNOSIS — I480 Paroxysmal atrial fibrillation: Secondary | ICD-10-CM | POA: Diagnosis not present

## 2024-05-13 DIAGNOSIS — I10 Essential (primary) hypertension: Secondary | ICD-10-CM | POA: Diagnosis not present

## 2024-05-15 DIAGNOSIS — I10 Essential (primary) hypertension: Secondary | ICD-10-CM | POA: Diagnosis not present

## 2024-05-15 DIAGNOSIS — R7302 Impaired glucose tolerance (oral): Secondary | ICD-10-CM | POA: Diagnosis not present

## 2024-05-15 DIAGNOSIS — E7800 Pure hypercholesterolemia, unspecified: Secondary | ICD-10-CM | POA: Diagnosis not present

## 2024-05-19 LAB — MISC LABCORP TEST (SEND OUT): Labcorp test code: 481080

## 2024-05-25 DIAGNOSIS — N8111 Cystocele, midline: Secondary | ICD-10-CM | POA: Diagnosis not present

## 2024-05-25 DIAGNOSIS — N898 Other specified noninflammatory disorders of vagina: Secondary | ICD-10-CM | POA: Diagnosis not present

## 2024-05-25 DIAGNOSIS — Z1211 Encounter for screening for malignant neoplasm of colon: Secondary | ICD-10-CM | POA: Diagnosis not present

## 2024-05-25 DIAGNOSIS — Z4689 Encounter for fitting and adjustment of other specified devices: Secondary | ICD-10-CM | POA: Diagnosis not present

## 2024-05-29 LAB — COLOGUARD: COLOGUARD: NEGATIVE

## 2024-07-14 DIAGNOSIS — Z23 Encounter for immunization: Secondary | ICD-10-CM | POA: Diagnosis not present

## 2024-07-20 DIAGNOSIS — L570 Actinic keratosis: Secondary | ICD-10-CM | POA: Diagnosis not present

## 2024-07-20 DIAGNOSIS — L578 Other skin changes due to chronic exposure to nonionizing radiation: Secondary | ICD-10-CM | POA: Diagnosis not present

## 2024-07-20 DIAGNOSIS — Z859 Personal history of malignant neoplasm, unspecified: Secondary | ICD-10-CM | POA: Diagnosis not present

## 2024-07-20 DIAGNOSIS — Z872 Personal history of diseases of the skin and subcutaneous tissue: Secondary | ICD-10-CM | POA: Diagnosis not present

## 2024-07-23 ENCOUNTER — Ambulatory Visit
Admission: RE | Admit: 2024-07-23 | Discharge: 2024-07-23 | Disposition: A | Discharge status: Home / Self Care | Source: Ambulatory Visit | Attending: Oncology | Admitting: Oncology

## 2024-07-23 DIAGNOSIS — R161 Splenomegaly, not elsewhere classified: Secondary | ICD-10-CM | POA: Diagnosis not present

## 2024-08-07 ENCOUNTER — Inpatient Hospital Stay: Attending: Oncology

## 2024-08-07 ENCOUNTER — Inpatient Hospital Stay: Admitting: Oncology

## 2024-08-07 ENCOUNTER — Encounter: Payer: Self-pay | Admitting: Oncology

## 2024-08-07 VITALS — BP 132/38 | HR 74 | Temp 98.2°F | Resp 18 | Ht <= 58 in | Wt 142.8 lb

## 2024-08-07 DIAGNOSIS — R161 Splenomegaly, not elsewhere classified: Secondary | ICD-10-CM | POA: Diagnosis not present

## 2024-08-07 DIAGNOSIS — D709 Neutropenia, unspecified: Secondary | ICD-10-CM

## 2024-08-07 DIAGNOSIS — D708 Other neutropenia: Secondary | ICD-10-CM

## 2024-08-07 DIAGNOSIS — D696 Thrombocytopenia, unspecified: Secondary | ICD-10-CM

## 2024-08-07 LAB — CBC WITH DIFFERENTIAL (CANCER CENTER ONLY)
Abs Immature Granulocytes: 0 K/uL (ref 0.00–0.07)
Basophils Absolute: 0 K/uL (ref 0.0–0.1)
Basophils Relative: 1 %
Eosinophils Absolute: 0 K/uL (ref 0.0–0.5)
Eosinophils Relative: 2 %
HCT: 37.9 % (ref 36.0–46.0)
Hemoglobin: 12.8 g/dL (ref 12.0–15.0)
Immature Granulocytes: 0 %
Lymphocytes Relative: 29 %
Lymphs Abs: 0.6 K/uL — ABNORMAL LOW (ref 0.7–4.0)
MCH: 30 pg (ref 26.0–34.0)
MCHC: 33.8 g/dL (ref 30.0–36.0)
MCV: 89 fL (ref 80.0–100.0)
Monocytes Absolute: 0.1 K/uL (ref 0.1–1.0)
Monocytes Relative: 4 %
Neutro Abs: 1.3 K/uL — ABNORMAL LOW (ref 1.7–7.7)
Neutrophils Relative %: 64 %
Platelet Count: 86 K/uL — ABNORMAL LOW (ref 150–400)
RBC: 4.26 MIL/uL (ref 3.87–5.11)
RDW: 13.2 % (ref 11.5–15.5)
WBC Count: 2 K/uL — ABNORMAL LOW (ref 4.0–10.5)
nRBC: 0 % (ref 0.0–0.2)

## 2024-08-07 NOTE — Progress Notes (Signed)
 Patient doing pretty good. She is slightly dizzy due to giving blood but states she's fine and has no new or acute concerns at this time.

## 2024-08-07 NOTE — Progress Notes (Signed)
 Hematology/Oncology Consult note [facility]  [phone] [phone]  Patient Care Team: Jeffie Cheryl BRAVO, MD as PCP - General (Family Medicine) Melanee Annah BROCKS, MD as Consulting Physician (Oncology)   Name of the patient: Kaitlin Wilson  [id number]  Apr 07, 1951   Date of visit: 08/07/24  Diagnosis-neutropenia and splenomegaly concerning for lymphoproliferative disorder  Chief complaint/ Reason for visit-follow-up of neutropenia and discuss ultrasound results and further management  Heme/Onc history: patient is a 73 year old female with a past medical history significant for hypertension hyperlipidemia GERD and arthritis referred for leukopenia.  Most recent CBC from 05/19/2021 showed a white count of 2.2, H&H of 12.5/36.3 with a platelet count of 146.  Differential mainly showed neutropenia with an ANC of 1.3And lymphopenia.  2 years ago in November 2020 patient had a normal white count of 5.1 with an ANC of 3.8.  In June 2022 her white count was 2.8 with an ANC of 1.9.  Patient was found to have B12 deficiency for which she isTaking oral B12.  HIV and hepatitis C testing negative.  Peripheral flow cytometry did not reveal any immunophenotypic abnormality. No monoclonal B cell population is detected. kappa:lambda ratio 0.6  There is no loss of, or aberrant expression of, the pan T cell antigens to suggest a neoplastic T cell process.  ANA comprehensive panel negative.  Rheumatoid factor negative    Interval history-  Kaitlin Wilson is a 73 year old female who presents for evaluation of an enlarged spleen.  She has experienced chronic leukopenia for nearly two years, with white blood cell counts fluctuating in the twos. Her current white blood cell count is two, and her neutrophils are approximately one. Despite these low counts, she has not experienced any infections.  In November 2023, she was noted to have an enlarged spleen, which has  now increased to approximately 21 centimeters.  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 She works part-time and is concerned about missing work. She prefers to delay the procedure until after Christmas due to financial constraints and holiday expenses. She does not have anyone to accompany her for the bone marrow biopsy, as her friend who could have helped has moved away.     ECOG PS- 0 Pain scale- 0   Review of systems- Review of Systems  Constitutional:  Negative for chills, fever, malaise/fatigue and weight loss.  HENT:  Negative for congestion, ear discharge and nosebleeds.   Eyes:  Negative for blurred vision.  Respiratory:  Negative for cough, hemoptysis, sputum production, shortness of breath and wheezing.   Cardiovascular:  Negative for chest pain, palpitations, orthopnea and claudication.  Gastrointestinal:  Negative for abdominal pain, blood in stool, constipation, diarrhea, heartburn, melena, nausea and vomiting.  Genitourinary:  Negative for dysuria, flank pain, frequency, hematuria and urgency.  Musculoskeletal:  Negative for back pain, joint pain and myalgias.  Skin:  Negative for rash.  Neurological:  Negative for dizziness, tingling, focal weakness, seizures, weakness and headaches.  Endo/Heme/Allergies:  Does not bruise/bleed easily.  Psychiatric/Behavioral:  Negative for depression and suicidal ideas. The patient does not have insomnia.       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   Procedure: TOTAL HIP ARTHROPLASTY ANTERIOR APPROACH;  Surgeon: Kathlynn Sharper, MD;  Location: [facility] ORS;  Service: Orthopedics;  Laterality: Right;    Social History   Socioeconomic History   Marital status: Single    Spouse name: Not on file   Number of children: Not on file   Years of education: Not on file   Highest education level: Not on file  Occupational History   Not on file  Tobacco Use   Smoking status: Never   Smokeless tobacco: Never  Vaping Use   Vaping status: Never Used  Substance and Sexual Activity   Alcohol use: No   Drug use: No   Sexual activity: Not Currently  Other Topics Concern   Not on file  Social History Narrative   Not on file   Social Drivers of Health   Financial Resource Strain: Low Risk  (05/13/2024)   Received from [facility]   Overall Financial Resource Strain (CARDIA)    Difficulty of Paying Living Expenses: Not hard at all  Food Insecurity: No Food Insecurity (05/13/2024)   Received from [facility]   Hunger Vital Sign    Within the past 12 months, you worried that your food would run out before you got the money to buy more.: Never true    Within the past 12 months, the food you bought just didn't last and you didn't have money to get more.: Never true  Transportation Needs: No Transportation Needs (05/13/2024)   Received from [facility] - Transportation    In the past 12 months, has lack of transportation kept you from medical appointments or from getting medications?: No    Lack of Transportation (Non-Medical): No  Physical Activity: Not on file  Stress: Not on file  Social Connections: Not on file  Intimate Partner Violence: Not on file    Family History  Problem Relation Age  of Onset   Brain cancer Mother    Hypertension Father    Stroke Father    Kidney cancer Brother    Heart attack Brother    Bladder Cancer Neg Hx      Current Outpatient Medications:    Cholecalciferol  (VITAMIN D ) 2000 units tablet, Take 2,000 Units by mouth daily., Disp: , Rfl:    esomeprazole (NEXIUM) 40 MG capsule, Take 40 mg by mouth., Disp: , Rfl:    lidocaine -prilocaine (EMLA) cream, APPLY TOPICALLY ONCE FOR 1 DOSE AS NEEDED FOR GYN EXAM EVERY 3 MTHS, Disp: , Rfl:    meclizine (ANTIVERT) 12.5 MG tablet, Take 12.5 mg by mouth 3 (three) times daily as needed., Disp: , Rfl:    metroNIDAZOLE  (METROGEL ) 0.75 % vaginal gel, Place 1 applicator vaginally., Disp: , Rfl:    triamterene-hydrochlorothiazide (MAXZIDE-25) 37.5-25 MG tablet, Take 1 tablet by mouth daily., Disp: , Rfl:    trolamine salicylate (ASPERCREME) 10 % cream, Apply 1 application topically as needed for muscle pain., Disp: , Rfl:    vitamin B-12 (CYANOCOBALAMIN ) 1000 MCG tablet, Take 1,000 mcg by mouth daily., Disp: ,  Rfl:    atorvastatin (LIPITOR) 10 MG tablet, Take 1 tablet by mouth daily. (Patient not taking: Reported on 02/04/2024), Disp: , Rfl:    losartan  (COZAAR ) 25 MG tablet, Take 12.5 mg by mouth daily. (Patient not taking: Reported on 08/07/2024), Disp: , Rfl:    omeprazole (PRILOSEC) 20 MG capsule, Take 20 mg by mouth. (Patient not taking: Reported on 08/07/2024), Disp: , Rfl:    traMADol  (ULTRAM ) 50 MG tablet, Take 1 tablet (50 mg total) by mouth every 6 (six) hours as needed. (Patient not taking: Reported on 08/07/2024), Disp: 30 tablet, Rfl: 0  Current Facility-Administered Medications:    betamethasone  acetate-betamethasone  sodium phosphate (CELESTONE ) injection 3 mg, 3 mg, Intramuscular, Once, Janit Thresa HERO, DPM  Physical exam:  Vitals:   08/07/24 1050  BP: (!) 132/38  Pulse: 74  Resp: 18  Temp: 98.2 F (36.8 C)  TempSrc: Tympanic  SpO2: 100%  Weight: 142 lb 12.8 oz (64.8 kg)  Height: 4' 9 (1.448 m)    Physical Exam Cardiovascular:     Rate and Rhythm: Normal rate and regular rhythm.     Heart sounds: Normal heart sounds.  Pulmonary:     Effort: Pulmonary effort is normal.     Breath sounds: Normal breath sounds.  Abdominal:     General: Bowel sounds are normal.     Palpations: Abdomen is soft.     Comments: Palpable splenomegaly 4 fingerbreadths below costal margin  Skin:    General: Skin is warm and dry.  Neurological:     Mental Status: She is alert and oriented to person, place, and time.      I have personally reviewed labs listed below:    Latest Ref Rng & Units 08/07/2023   10:18 AM  CMP  Glucose 70 - 99 mg/dL 898   BUN 8 - 23 mg/dL 13   Creatinine 9.55 - 1.00 mg/dL 9.06   Sodium 864 - 854 mmol/L 135   Potassium 3.5 - 5.1 mmol/L 4.3   Chloride 98 - 111 mmol/L 103   CO2 22 - 32 mmol/L 25   Calcium 8.9 - 10.3 mg/dL 9.3   Total Protein 6.5 - 8.1 g/dL 7.0   Total Bilirubin <8.7 mg/dL 0.7   Alkaline Phos 38 - 126 U/L 69   AST 15 - 41 U/L 19   ALT 0 - 44 U/L 14       Latest Ref Rng & Units 08/07/2024   10:04 AM  CBC  WBC 4.0 - 10.5 K/uL 2.0   Hemoglobin 12.0 - 15.0 g/dL 87.1   Hematocrit 63.9 - 46.0 % 37.9   Platelets 150 - 400 K/uL 86    I have personally reviewed Radiology images listed below: No images are attached to the encounter.  US  SPLEEN (ABDOMEN LIMITED) Result Date: 07/25/2024 CLINICAL DATA:  Splenomegaly EXAM: ULTRASOUND ABDOMEN LIMITED COMPARISON:  08/01/2022 FINDINGS: Spleen: 21.4 x 8.2 x 21.5 cm. Volume = 1969 CC. No focal abnormality of the spleen. IMPRESSION: Interval increase in splenomegaly with current volume measuring 1969 CC compared to 1199 CC on 08/01/2022. Electronically Signed   By: Aliene Lloyd M.D.   On: 07/25/2024 07:38     Assessment and plan- Patient is a 73 y.o. female here for routine follow-up of splenomegaly and neutropenia  Patient has had chronic leukopenia/neutropenia at least dating back for the last 2 years which  has been waxing and waning and neutrophils have stayed more than 1.  She does have some concomitant thrombocytopenia  as well.  Today her platelet count is 86 slightly worse as compared to her platelet counts in the past which have been mostly in 100s to 110s.  Hemoglobin is normal.  Patient is noted to have splenomegaly of 14 cm back in June 2023.  Follow-up ultrasound spleen done on 07/23/2024 shows severe splenomegaly of 21.4 cm with a volume of 1969 cc.  Neutropenia in conjunction with splenomegaly is concerning for potential causes such as hairy cell leukemia or LGL leukemia.  Patient did have T-cell gene rearrangement test back in August which showed clonal population emerging from polyclonal background which could reflect a reactive process or emergence of a malignant clone.  I have repeatedly asked patient to consider bone marrow biopsy and she has turned it down in the past.  Now in the setting of worsening splenomegaly I would like to get a PET CT scan to a certain degree of hypermetabolism in the spleen as well as to see if there is any evidence of adenopathy elsewhere.  Ultimately patient will need bone marrow biopsy for diagnosis given the significant splenomegaly and she has a risk of splenic rupture if her splenomegaly is left untreated.  I would not favor proceeding for splenectomy without bone marrow biopsy as she could potentially be given medical treatment options for her splenomegaly if her diagnosis was made on the basis of bone marrow biopsy.  Patient would like to defer bone marrow biopsy to January 2026 until after the Christmas holidays.  I will plan on getting PET/CT scan ASAP and see her back in 3 weeks time to discuss the results of PET scan.  I will also plan to repeat T-cell gene rearrangement assay at that time myeloma panel and serum free light chains   Visit Diagnosis 1. Splenomegaly   2. Other neutropenia      Dr. Annah Skene, MD, MPH [facility] [phone] 08/07/2024 1:20 PM

## 2024-08-10 ENCOUNTER — Other Ambulatory Visit

## 2024-08-11 ENCOUNTER — Ambulatory Visit
Admission: RE | Admit: 2024-08-11 | Discharge: 2024-08-11 | Disposition: A | Discharge status: Home / Self Care | Source: Ambulatory Visit | Attending: Oncology | Admitting: Oncology

## 2024-08-11 DIAGNOSIS — K2289 Other specified disease of esophagus: Secondary | ICD-10-CM | POA: Diagnosis not present

## 2024-08-11 DIAGNOSIS — R161 Splenomegaly, not elsewhere classified: Secondary | ICD-10-CM | POA: Insufficient documentation

## 2024-08-11 DIAGNOSIS — E041 Nontoxic single thyroid nodule: Secondary | ICD-10-CM | POA: Diagnosis not present

## 2024-08-11 LAB — GLUCOSE, CAPILLARY: Glucose-Capillary: 101 mg/dL — ABNORMAL HIGH (ref 70–99)

## 2024-08-11 MED ORDER — FLUDEOXYGLUCOSE F - 18 (FDG) INJECTION
7.4200 | Freq: Once | INTRAVENOUS | Status: AC | PRN
  Administered 2024-08-11: 7.42 via INTRAVENOUS

## 2024-08-12 DIAGNOSIS — Z4689 Encounter for fitting and adjustment of other specified devices: Secondary | ICD-10-CM | POA: Diagnosis not present

## 2024-08-12 DIAGNOSIS — N898 Other specified noninflammatory disorders of vagina: Secondary | ICD-10-CM | POA: Diagnosis not present

## 2024-08-12 DIAGNOSIS — N8111 Cystocele, midline: Secondary | ICD-10-CM | POA: Diagnosis not present

## 2024-09-08 ENCOUNTER — Encounter: Payer: Self-pay | Admitting: Oncology

## 2024-09-08 ENCOUNTER — Inpatient Hospital Stay: Attending: Oncology

## 2024-09-08 ENCOUNTER — Inpatient Hospital Stay: Admitting: Oncology

## 2024-09-08 VITALS — BP 108/56 | HR 72 | Temp 98.2°F | Resp 18 | Ht <= 58 in | Wt 143.4 lb

## 2024-09-08 DIAGNOSIS — D708 Other neutropenia: Secondary | ICD-10-CM | POA: Insufficient documentation

## 2024-09-08 DIAGNOSIS — R161 Splenomegaly, not elsewhere classified: Secondary | ICD-10-CM | POA: Diagnosis present

## 2024-09-08 DIAGNOSIS — D696 Thrombocytopenia, unspecified: Secondary | ICD-10-CM | POA: Insufficient documentation

## 2024-09-08 LAB — CBC WITH DIFFERENTIAL (CANCER CENTER ONLY)
Abs Immature Granulocytes: 0.01 K/uL (ref 0.00–0.07)
Basophils Absolute: 0 K/uL (ref 0.0–0.1)
Basophils Relative: 1 %
Eosinophils Absolute: 0 K/uL (ref 0.0–0.5)
Eosinophils Relative: 2 %
HCT: 36.9 % (ref 36.0–46.0)
Hemoglobin: 12.5 g/dL (ref 12.0–15.0)
Immature Granulocytes: 1 %
Lymphocytes Relative: 26 %
Lymphs Abs: 0.5 K/uL — ABNORMAL LOW (ref 0.7–4.0)
MCH: 29.8 pg (ref 26.0–34.0)
MCHC: 33.9 g/dL (ref 30.0–36.0)
MCV: 88.1 fL (ref 80.0–100.0)
Monocytes Absolute: 0.1 K/uL (ref 0.1–1.0)
Monocytes Relative: 3 %
Neutro Abs: 1.4 K/uL — ABNORMAL LOW (ref 1.7–7.7)
Neutrophils Relative %: 67 %
Platelet Count: 93 K/uL — ABNORMAL LOW (ref 150–400)
RBC: 4.19 MIL/uL (ref 3.87–5.11)
RDW: 13.1 % (ref 11.5–15.5)
WBC Count: 2.1 K/uL — ABNORMAL LOW (ref 4.0–10.5)
nRBC: 0 % (ref 0.0–0.2)

## 2024-09-08 NOTE — Progress Notes (Unsigned)
 Patient states lab work that was done today made her feel a little funny. No new or acute concerns at this time.

## 2024-09-09 LAB — KAPPA/LAMBDA LIGHT CHAINS
Kappa free light chain: 16.1 mg/L (ref 3.3–19.4)
Kappa, lambda light chain ratio: 1.15 (ref 0.26–1.65)
Lambda free light chains: 14 mg/L (ref 5.7–26.3)

## 2024-09-10 LAB — MULTIPLE MYELOMA PANEL, SERUM
Albumin SerPl Elph-Mcnc: 3.4 g/dL (ref 2.9–4.4)
Albumin/Glob SerPl: 1.1 (ref 0.7–1.7)
Alpha 1: 0.3 g/dL (ref 0.0–0.4)
Alpha2 Glob SerPl Elph-Mcnc: 0.7 g/dL (ref 0.4–1.0)
B-Globulin SerPl Elph-Mcnc: 1.1 g/dL (ref 0.7–1.3)
Gamma Glob SerPl Elph-Mcnc: 1 g/dL (ref 0.4–1.8)
Globulin, Total: 3.1 g/dL (ref 2.2–3.9)
IgA: 158 mg/dL (ref 64–422)
IgG (Immunoglobin G), Serum: 962 mg/dL (ref 586–1602)
IgM (Immunoglobulin M), Srm: 16 mg/dL — ABNORMAL LOW (ref 26–217)
Total Protein ELP: 6.5 g/dL (ref 6.0–8.5)

## 2024-09-14 NOTE — Progress Notes (Signed)
 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  Most recent CBC from 05/19/2021 showed a white count of 2.2, H&H of 12.5/36.3 with a platelet count of 146.  Differential mainly showed neutropenia with an ANC of 1.3And lymphopenia.  2 years ago in November 2020 patient had a normal white count of 5.1 with an ANC of 3.8.  In June 2022 her white count was 2.8 with an ANC of 1.9.  Patient was found to have B12 deficiency for which she isTaking oral B12.  HIV and hepatitis C testing negative.  Peripheral flow cytometry did not reveal any immunophenotypic abnormality. No monoclonal B cell population is detected. kappa:lambda ratio 0.6 .There is no loss of, or aberrant expression of, the pan T cell antigens to suggest a neoplastic T cell process.  ANA comprehensive panel negative.  Rheumatoid factor negative  T-cell gene rearrangement assay which was done on 05/12/2024 showed clonal population emerging from polyclonal background.  This could reflect a reactive process or could indicate emergence of a malignant clone.  Analysis of a follow-up specimen may be useful for monitoring emerging clonal population  PET CT scan on 08/11/2024 showed splenomegaly of 18.3 cm with homogeneous metabolic activity of 3.4.  Right thyroid  hypermetabolic nodule for which ultrasound  was recommended.  No other suspicious adenopathy.    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  She has had persistent leukopenia and neutropenia for at least three to four months, with recent repeat laboratory evaluation to further characterize these cytopenias. Recent PET imaging showed a large spleen, and the patient recalls being told there were no abnormal lymph nodes.  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 She denies abnormal bleeding, bruising, fevers, chills, night sweats, or new lymphadenopathy.  She inquired about treatment options for LGL leukemia and is aware of oral therapies such as methotrexate. She has expressed a preference to avoid bone marrow biopsy if possible. She feels well overall and has no new complaints.      ECOG PS- 1 Pain scale- 0   Review of systems- Review of Systems  Constitutional:  Negative for chills, fever, malaise/fatigue and weight loss.  HENT:  Negative for congestion, ear discharge and nosebleeds.   Eyes:  Negative for blurred vision.  Respiratory:  Negative for cough, hemoptysis, sputum production, shortness of breath and wheezing.   Cardiovascular:  Negative for chest pain, palpitations, orthopnea and claudication.  Gastrointestinal:  Negative for abdominal pain, blood in stool, constipation, diarrhea, heartburn, melena, nausea and vomiting.  Genitourinary:  Negative for dysuria, flank pain, frequency, hematuria and urgency.  Musculoskeletal:  Negative for back pain, joint pain and myalgias.  Skin:  Negative for rash.  Neurological:  Negative for dizziness, tingling, focal weakness, seizures, weakness and headaches.  Endo/Heme/Allergies:  Does not bruise/bleed easily.   Psychiatric/Behavioral:  Negative for depression and suicidal ideas. The patient does not have insomnia.       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   Procedure: TOTAL HIP ARTHROPLASTY ANTERIOR APPROACH;  Surgeon: Kathlynn Sharper, MD;  Location: [facility] ORS;  Service: Orthopedics;  Laterality: Right;    Social History   Socioeconomic History   Marital status: Single    Spouse name: Not on file   Number of children: Not on file   Years of education: Not on file   Highest education level: Not on file  Occupational History   Not on file  Tobacco Use   Smoking status: Never   Smokeless tobacco: Never  Vaping Use   Vaping status: Never Used  Substance and Sexual Activity   Alcohol use: No   Drug use: No   Sexual activity: Not Currently  Other Topics Concern   Not on file  Social History Narrative   Not on file   Social Drivers of Health   Tobacco Use: Low Risk (09/08/2024)   Patient History    Smoking Tobacco Use: Never    Smokeless Tobacco Use: Never    Passive Exposure: Not on file  Financial Resource Strain: Low Risk  (05/13/2024)   Received from [facility]   Overall Financial Resource Strain (CARDIA)    Difficulty of Paying Living Expenses: Not hard at all  Food Insecurity: No Food Insecurity (05/13/2024)   Received from [facility]   Epic    Within the past 12 months, you worried that your food would run  out before you got the money to buy more.: Never true    Within the past 12 months, the food you bought just didn't last and you didn't have money to get more.: Never true  Transportation Needs: No Transportation Needs (05/13/2024)   Received from [facility] - Transportation    In the past 12 months, has lack of transportation kept you from medical appointments or from getting medications?: No    Lack of Transportation (Non-Medical): No  Physical Activity: Not on file  Stress: Not on file  Social Connections: Not on file  Intimate Partner Violence: Not on file  Depression (PHQ2-9): Low Risk (09/08/2024)   Depression (PHQ2-9)    PHQ-2 Score: 0  Alcohol Screen: Not on file  Housing: Low Risk  (05/13/2024)   Received from [facility]   Epic    At any time in the past 12 months, were you homeless or living in a shelter (including now)?: No    In the past 12 months, how many times have you moved where you were living?: 0    In the last 12 months, was there a time when you were not able to pay the mortgage or rent on time?: No  Utilities: Not At Risk (05/13/2024)  Received from [facility]   Epic    In the past 12 months has the electric, gas, oil, or water company threatened to shut off services in your home?: No  Health Literacy: Not on file    Family History  Problem Relation Age of Onset   Brain cancer Mother    Hypertension Father    Stroke Father    Kidney cancer Brother    Heart attack Brother    Bladder Cancer Neg Hx     Current Medications[2]  Physical exam:  Vitals:   09/08/24 1049  BP: (!) 108/56  Pulse: 72  Resp: 18  Temp: 98.2 F (36.8 C)  TempSrc: Tympanic  SpO2: 100%  Weight: 143 lb 6.4 oz (65 kg)  Height: 4' 9 (1.448 m)   Physical Exam Cardiovascular:     Rate and Rhythm: Normal rate and regular rhythm.     Heart sounds: Normal heart sounds.  Pulmonary:     Effort: Pulmonary effort is  normal.     Breath sounds: Normal breath sounds.  Skin:    General: Skin is warm and dry.  Neurological:     Mental Status: She is alert and oriented to person, place, and time.      I have personally reviewed labs listed below:    Latest Ref Rng & Units 08/07/2023   10:18 AM  CMP  Glucose 70 - 99 mg/dL 898   BUN 8 - 23 mg/dL 13   Creatinine 9.55 - 1.00 mg/dL 9.06   Sodium 864 - 854 mmol/L 135   Potassium 3.5 - 5.1 mmol/L 4.3   Chloride 98 - 111 mmol/L 103   CO2 22 - 32 mmol/L 25   Calcium 8.9 - 10.3 mg/dL 9.3   Total Protein 6.5 - 8.1 g/dL 7.0   Total Bilirubin <8.7 mg/dL 0.7   Alkaline Phos 38 - 126 U/L 69   AST 15 - 41 U/L 19   ALT 0 - 44 U/L 14       Latest Ref Rng & Units 09/08/2024   10:07 AM  CBC  WBC 4.0 - 10.5 K/uL 2.1   Hemoglobin 12.0 - 15.0 g/dL 87.4   Hematocrit 63.9 - 46.0 % 36.9   Platelets 150 - 400 K/uL 93      Assessment and plan- Patient is a 73 y.o. 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  Prior T-cell gene rearrangement assay showed possible emergence of a malignant clone and repeat testing is recommended.  This has been done today and is pending. - Await specialized blood test results to confirm or rule out LGL leukemia. - If confirmed, initiate methotrexate and steroids without bone marrow biopsy. - If inconclusive, consider bone marrow biopsy. - Follow-up in four weeks to review results and discuss next steps.  Splenomegaly Persistent splenomegaly likely related to suspected LGL leukemia.  Neutropenia and leukopenia Persistent neutropenia and leukopenia likely secondary to suspected LGL leukemia and splenomegaly. - Monitor white blood cell and neutrophil counts with ongoing laboratory evaluation. - Correlate cytopenias with results of pending blood tests and overall diagnostic workup.     I will  also discuss thyroid  ultrasound with her which was incidentally found to be hypermetabolic on her PET scan at my next visit    Visit Diagnosis 1. Splenomegaly   2. Thrombocytopenia      Dr. Annah Skene,  MD, MPH [facility] at [facility] [phone] 09/14/2024 9:12 AM                   [1]  Allergies Allergen Reactions   Rosuvastatin Nausea Only  [2]  Current Outpatient Medications:    Cholecalciferol  (VITAMIN D ) 2000 units tablet, Take 2,000 Units by mouth daily., Disp: , Rfl:    esomeprazole (NEXIUM) 40 MG capsule, Take 40 mg by mouth., Disp: , Rfl:    lidocaine -prilocaine (EMLA) cream, APPLY TOPICALLY ONCE FOR 1 DOSE AS NEEDED FOR GYN EXAM EVERY 3 MTHS, Disp: , Rfl:    meclizine (ANTIVERT) 12.5 MG tablet, Take 12.5 mg by mouth 3 (three) times daily as needed., Disp: , Rfl:    metroNIDAZOLE  (METROGEL ) 0.75 % vaginal gel, Place 1 applicator vaginally., Disp: , Rfl:    omeprazole (PRILOSEC) 20 MG capsule, Take 20 mg by mouth., Disp: , Rfl:    triamterene-hydrochlorothiazide (MAXZIDE-25) 37.5-25 MG tablet, Take 1 tablet by mouth daily., Disp: , Rfl:    trolamine salicylate (ASPERCREME) 10 % cream, Apply 1 application topically as needed for muscle pain., Disp: , Rfl:    vitamin B-12 (CYANOCOBALAMIN ) 1000 MCG tablet, Take 1,000 mcg by mouth daily., Disp: , Rfl:    atorvastatin (LIPITOR) 10 MG tablet, Take 1 tablet by mouth daily. (Patient not taking: Reported on 09/08/2024), Disp: , Rfl:    losartan  (COZAAR ) 25 MG tablet, Take 12.5 mg by mouth daily. (Patient not taking: Reported on 09/08/2024), Disp: , Rfl:    traMADol  (ULTRAM ) 50 MG tablet, Take 1 tablet (50 mg total) by mouth every 6 (six) hours as needed. (Patient not taking: Reported on 09/08/2024), Disp: 30 tablet, Rfl: 0  Current Facility-Administered Medications:    betamethasone  acetate-betamethasone  sodium phosphate (CELESTONE ) injection 3 mg, 3 mg, Intramuscular, Once, Janit, Brent M, DPM

## 2024-09-18 LAB — MISC LABCORP TEST (SEND OUT): Labcorp test code: 4810810

## 2024-09-20 LAB — T CELL PANEL (GAMMA, BETA)

## 2024-10-06 ENCOUNTER — Encounter: Payer: Self-pay | Admitting: Oncology

## 2024-10-06 ENCOUNTER — Telehealth: Payer: Self-pay

## 2024-10-06 ENCOUNTER — Inpatient Hospital Stay: Attending: Oncology

## 2024-10-06 ENCOUNTER — Inpatient Hospital Stay: Admitting: Oncology

## 2024-10-06 VITALS — BP 118/82 | HR 70 | Temp 98.6°F | Resp 19 | Wt 145.5 lb

## 2024-10-06 DIAGNOSIS — Z8051 Family history of malignant neoplasm of kidney: Secondary | ICD-10-CM | POA: Diagnosis not present

## 2024-10-06 DIAGNOSIS — D709 Neutropenia, unspecified: Secondary | ICD-10-CM

## 2024-10-06 DIAGNOSIS — D696 Thrombocytopenia, unspecified: Secondary | ICD-10-CM

## 2024-10-06 DIAGNOSIS — Z808 Family history of malignant neoplasm of other organs or systems: Secondary | ICD-10-CM | POA: Insufficient documentation

## 2024-10-06 DIAGNOSIS — R161 Splenomegaly, not elsewhere classified: Secondary | ICD-10-CM

## 2024-10-06 LAB — CBC WITH DIFFERENTIAL (CANCER CENTER ONLY)
Abs Immature Granulocytes: 0.01 K/uL (ref 0.00–0.07)
Basophils Absolute: 0 K/uL (ref 0.0–0.1)
Basophils Relative: 1 %
Eosinophils Absolute: 0 K/uL (ref 0.0–0.5)
Eosinophils Relative: 2 %
HCT: 36.2 % (ref 36.0–46.0)
Hemoglobin: 12.1 g/dL (ref 12.0–15.0)
Immature Granulocytes: 1 %
Lymphocytes Relative: 34 %
Lymphs Abs: 0.7 K/uL (ref 0.7–4.0)
MCH: 30.3 pg (ref 26.0–34.0)
MCHC: 33.4 g/dL (ref 30.0–36.0)
MCV: 90.5 fL (ref 80.0–100.0)
Monocytes Absolute: 0.1 K/uL (ref 0.1–1.0)
Monocytes Relative: 5 %
Neutro Abs: 1.1 K/uL — ABNORMAL LOW (ref 1.7–7.7)
Neutrophils Relative %: 57 %
Platelet Count: 85 K/uL — ABNORMAL LOW (ref 150–400)
RBC: 4 MIL/uL (ref 3.87–5.11)
RDW: 12.7 % (ref 11.5–15.5)
WBC Count: 1.9 K/uL — ABNORMAL LOW (ref 4.0–10.5)
nRBC: 0 % (ref 0.0–0.2)

## 2024-10-06 LAB — CMP (CANCER CENTER ONLY)
ALT: 10 U/L (ref 0–44)
AST: 22 U/L (ref 15–41)
Albumin: 4.3 g/dL (ref 3.5–5.0)
Alkaline Phosphatase: 79 U/L (ref 38–126)
Anion gap: 8 (ref 5–15)
BUN: 12 mg/dL (ref 8–23)
CO2: 27 mmol/L (ref 22–32)
Calcium: 9.3 mg/dL (ref 8.9–10.3)
Chloride: 105 mmol/L (ref 98–111)
Creatinine: 0.83 mg/dL (ref 0.44–1.00)
GFR, Estimated: >60 mL/min
Glucose, Bld: 78 mg/dL (ref 70–99)
Potassium: 4.3 mmol/L (ref 3.5–5.1)
Sodium: 140 mmol/L (ref 135–145)
Total Bilirubin: 0.6 mg/dL (ref 0.0–1.2)
Total Protein: 6.9 g/dL (ref 6.5–8.1)

## 2024-10-06 NOTE — Telephone Encounter (Signed)
 Outbound call to patient to indicate alternative time proposed Tues 1/27 at the 9:30a an arrive at 8:30a. Unable to leave a voice message, will call back again tomorrow.

## 2024-10-06 NOTE — Progress Notes (Signed)
 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  Most recent CBC from 05/19/2021 showed a white count of 2.2, H&H of 12.5/36.3 with a platelet count of 146.  Differential mainly showed neutropenia with an ANC of 1.3And lymphopenia.  2 years ago in November 2020 patient had a normal white count of 5.1 with an ANC of 3.8.  In June 2022 her white count was 2.8 with an ANC of 1.9.  Patient was found to have B12 deficiency for which she isTaking oral B12.  HIV and hepatitis C testing negative.  Peripheral flow cytometry did not reveal any immunophenotypic abnormality. No monoclonal B cell population is detected. kappa:lambda ratio 0.6 .There is no loss of, or aberrant expression of, the pan T cell antigens to suggest a neoplastic T cell process.  ANA comprehensive panel negative.  Rheumatoid factor negative   T-cell gene rearrangement assay which was done on 05/12/2024 showed clonal population emerging from polyclonal background.  This could reflect a reactive process or could indicate emergence of a malignant clone.  Analysis of a follow-up specimen may be useful for monitoring emerging clonal population. Repeat T-cell gene rearrangement assay in December 2025 showed persistence of clonal T-cell population in  gamma gene   Ultrasound lesion on 07/23/2024 showed interval increase in  splenomegaly with a size of 21.4 cm and volume of 1969 cc. PET CT scan on 08/11/2024 showed splenomegaly of 18.3 cm with homogeneous metabolic activity of 3.4.  Right thyroid  hypermetabolic nodule for which ultrasound was recommended.  No other suspicious adenopathy.     Interval history-patient is presently doing well and denies any specific complaints at this time.  No recurrent infections denies any abdominal pain.  Appetite and weight have remained stable  ECOG PS- 0 Pain scale- 0   Review of systems- Review of Systems  Constitutional:  Negative for chills, fever, malaise/fatigue and weight loss.  HENT:  Negative for congestion, ear discharge and nosebleeds.   Eyes:  Negative for blurred vision.  Respiratory:  Negative for cough, hemoptysis, sputum production, shortness of breath and wheezing.   Cardiovascular:  Negative for chest pain, palpitations, orthopnea and claudication.  Gastrointestinal:  Negative for abdominal pain, blood in stool, constipation, diarrhea, heartburn, melena, nausea and vomiting.  Genitourinary:  Negative for dysuria, flank pain, frequency, hematuria and urgency.  Musculoskeletal:  Negative for back pain, joint pain and myalgias.  Skin:  Negative for rash.  Neurological:  Negative for dizziness, tingling, focal weakness, seizures, weakness and headaches.  Endo/Heme/Allergies:  Does not bruise/bleed easily.  Psychiatric/Behavioral:  Negative for depression and suicidal ideas. The patient does not have insomnia.       Allergies[1]   Past Medical History:  Diagnosis Date   Arthritis    Dysrhythmia    GERD (gastroesophageal reflux disease)    Hyperlipidemia    Hypertension    Osteopenia of hip 2020   Bone spares   Overactive bladder    Vertigo  Vertigo      Past Surgical History:  Procedure Laterality Date   ESOPHAGOGASTRODUODENOSCOPY (EGD) WITH PROPOFOL  N/A 06/16/2021   Procedure: ESOPHAGOGASTRODUODENOSCOPY (EGD) WITH PROPOFOL ;  Surgeon:  Maryruth Ole DASEN, MD;  Location: [facility] ENDOSCOPY;  Service: Endoscopy;  Laterality: N/A;   TOTAL HIP ARTHROPLASTY Right 10/24/2021   Procedure: TOTAL HIP ARTHROPLASTY ANTERIOR APPROACH;  Surgeon: Kathlynn Sharper, MD;  Location: [facility] ORS;  Service: Orthopedics;  Laterality: Right;    Social History   Socioeconomic History   Marital status: Single    Spouse name: Not on file   Number of children: Not on file   Years of education: Not on file   Highest education level: Not on file  Occupational History   Not on file  Tobacco Use   Smoking status: Never   Smokeless tobacco: Never  Vaping Use   Vaping status: Never Used  Substance and Sexual Activity   Alcohol use: No   Drug use: No   Sexual activity: Not Currently  Other Topics Concern   Not on file  Social History Narrative   Not on file   Social Drivers of Health   Tobacco Use: Low Risk (10/06/2024)   Patient History    Smoking Tobacco Use: Never    Smokeless Tobacco Use: Never    Passive Exposure: Not on file  Financial Resource Strain: Low Risk  (05/13/2024)   Received from [facility]   Overall Financial Resource Strain (CARDIA)    Difficulty of Paying Living Expenses: Not hard at all  Food Insecurity: No Food Insecurity (05/13/2024)   Received from [facility]   Epic    Within the past 12 months, you worried that your food would run out before you got the money to buy more.: Never true    Within the past 12 months, the food you bought just didn't last and you didn't have money to get more.: Never true  Transportation Needs: No Transportation Needs (05/13/2024)   Received from [facility] - Transportation    In the past 12 months, has lack of transportation kept you from medical appointments or from getting medications?: No    Lack of Transportation (Non-Medical): No  Physical Activity: Not on file  Stress: Not on file  Social Connections: Not on file   Intimate Partner Violence: Not on file  Depression (PHQ2-9): Low Risk (09/08/2024)   Depression (PHQ2-9)    PHQ-2 Score: 0  Alcohol Screen: Not on file  Housing: Low Risk  (05/13/2024)   Received from [facility]   Epic    At any time in the past 12 months, were you homeless or living in a shelter (including now)?: No    In the past 12 months, how many times have you moved where you were living?: 0    In the last 12 months, was there a time when you were not able to pay the mortgage or rent on time?: No  Utilities: Not At Risk (05/13/2024)   Received from [facility]   Epic    In the past 12 months has the electric, gas, oil, or water company threatened to shut off services in your home?: No  Health Literacy: Not on file    Family History  Problem Relation Age of Onset   Brain cancer Mother    Hypertension Father    Stroke Father    Kidney cancer Brother    Heart attack Brother  Bladder Cancer Neg Hx     Current Medications[2]  Physical exam:  Vitals:   10/06/24 0931  BP: 118/82  Pulse: 70  Resp: 19  Temp: 98.6 F (37 C)  SpO2: 99%  Weight: 145 lb 8 oz (66 kg)   Physical Exam Cardiovascular:     Rate and Rhythm: Normal rate and regular rhythm.     Heart sounds: Normal heart sounds.  Pulmonary:     Effort: Pulmonary effort is normal.     Breath sounds: Normal breath sounds.  Abdominal:     Comments: Palpable splenomegaly  Skin:    General: Skin is warm and dry.  Neurological:     Mental Status: She is alert and oriented to person, place, and time.      I have personally reviewed labs listed below:    Latest Ref Rng & Units 10/06/2024    9:01 AM  CMP  Glucose 70 - 99 mg/dL 78   BUN 8 - 23 mg/dL 12   Creatinine 9.55 - 1.00 mg/dL 9.16   Sodium 864 - 854 mmol/L 140   Potassium 3.5 - 5.1 mmol/L 4.3   Chloride 98 - 111 mmol/L 105   CO2 22 - 32 mmol/L 27   Calcium 8.9 - 10.3 mg/dL 9.3   Total Protein 6.5 - 8.1 g/dL 6.9    Total Bilirubin 0.0 - 1.2 mg/dL 0.6   Alkaline Phos 38 - 126 U/L 79   AST 15 - 41 U/L 22   ALT 0 - 44 U/L 10       Latest Ref Rng & Units 10/06/2024    9:01 AM  CBC  WBC 4.0 - 10.5 K/uL 1.9   Hemoglobin 12.0 - 15.0 g/dL 87.8   Hematocrit 63.9 - 46.0 % 36.2   Platelets 150 - 400 K/uL 85      Assessment and plan- Patient is a 74 y.o. 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  Neutrophils have remained between 1-1.3.  She also has some ongoing thrombocytopenia.  Platelet counts were 121 a year ago and now drifted down to 85.  Hemoglobin is normal.  There is presence of significant splenomegaly with a spleen size of 21 cm.  T-cell gene rearrangement assay in August and December 2025 has revealed an emerging clonal T-cell population in the gamma gene.  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  Although she may not require treatment strictly from her neutropenia standpoint since her neutrophils always remain more than 1 and she has not had any significant infections I am more concerned about her significant splenomegaly which needs attention.  Her thrombocytopenia could be secondary to splenomegaly.  I would first like to get a bone marrow biopsy to rule out any other etiology for her splenomegaly.  If that is ruled out I am going to treat her presumptively as T-cell LGL based on her blood work with steroids plus methotrexate based regimen.  Patient insists on getting a bone marrow biopsy only towards the last week of January and I will see her a week after to discuss bone marrow biopsy results and further management   Visit Diagnosis 1. Thrombocytopenia   2. Splenomegaly      Dr. Annah Skene, MD, MPH [facility] [phone] 10/06/2024 1:30 PM                   [  1]  Allergies Allergen Reactions   Rosuvastatin  Nausea Only  [2]  Current Outpatient Medications:    Cholecalciferol  (VITAMIN D ) 2000 units tablet, Take 2,000 Units by mouth daily., Disp: , Rfl:    lidocaine -prilocaine (EMLA) cream, APPLY TOPICALLY ONCE FOR 1 DOSE AS NEEDED FOR GYN EXAM EVERY 3 MTHS, Disp: , Rfl:    meclizine (ANTIVERT) 12.5 MG tablet, Take 12.5 mg by mouth 3 (three) times daily as needed., Disp: , Rfl:    metroNIDAZOLE  (METROGEL ) 0.75 % vaginal gel, Place 1 applicator vaginally., Disp: , Rfl:    omeprazole (PRILOSEC) 20 MG capsule, Take 20 mg by mouth., Disp: , Rfl:    traMADol  (ULTRAM ) 50 MG tablet, Take 1 tablet (50 mg total) by mouth every 6 (six) hours as needed., Disp: 30 tablet, Rfl: 0   triamterene-hydrochlorothiazide (MAXZIDE-25) 37.5-25 MG tablet, Take 1 tablet by mouth daily., Disp: , Rfl:    trolamine salicylate (ASPERCREME) 10 % cream, Apply 1 application topically as needed for muscle pain., Disp: , Rfl:    vitamin B-12 (CYANOCOBALAMIN ) 1000 MCG tablet, Take 1,000 mcg by mouth daily., Disp: , Rfl:    atorvastatin (LIPITOR) 10 MG tablet, Take 1 tablet by mouth daily. (Patient not taking: Reported on 10/06/2024), Disp: , Rfl:    esomeprazole (NEXIUM) 40 MG capsule, Take 40 mg by mouth. (Patient not taking: Reported on 10/06/2024), Disp: , Rfl:    losartan  (COZAAR ) 25 MG tablet, Take 12.5 mg by mouth daily. (Patient not taking: Reported on 10/06/2024), Disp: , Rfl:   Current Facility-Administered Medications:    betamethasone  acetate-betamethasone  sodium phosphate (CELESTONE ) injection 3 mg, 3 mg, Intramuscular, Once, Janit, Thresa HERO, DPM  "

## 2024-10-06 NOTE — Telephone Encounter (Signed)
 Per OV wrap up Patient needs a bone marrow biopsy last week of January 2026 only.  Patient's granddaughter will be bringing her and it was asked about whether or not caregiver can drop her off and come back after procedure.  Touched base with Clarita who indicated the caregiver does not have to stay; They can leave and come back. The IR nurse will take their number and can call them when the patient is almost ready to leave, etc. Next available date: Tues 1/27 at 8:30a an arrive at 7:30a.  Outbound call to patient; informed of above. Says granddaughter works 3rd shift and doesn't get out of work until [place].  Patient was in the store and asked for a call back later this afternoon.    Per Clarita we can put her on Tues 1/27 at the 9:30a an arrive at 8:30a.

## 2024-10-07 NOTE — Telephone Encounter (Signed)
 Outbound call to patient informed of below.  Patient prefers to keep initially proposed time Tues 1/27 at 8:30a an arrive at 7:30 so that her granddaughter can get some rest sooner (works 3rd shift).  Informed NPO after midnight, would need a driver, and [facility].  Patient verbalized understanding.

## 2024-10-13 ENCOUNTER — Encounter: Payer: Self-pay | Admitting: Oncology

## 2024-10-13 LAB — T CELL PANEL (GAMMA, BETA)

## 2024-10-14 NOTE — Telephone Encounter (Signed)
 Patient scheduled for bone marrow biopsy. Per Clarita patient is scheduled her for Tues 1/27 at 8:30a an arrive at 7:30a. Is there anyway that we can move her to Wed 1/28 at the same time? Outbound call to patient; patient states she cannot switch days because she already got the time off work approved.  Patient also indicated she spoke with [facility] a few days ago and they did not see an authorization on file for the bone marrow biopsy.  I informed patient I would relay this information to [place].

## 2024-10-25 ENCOUNTER — Other Ambulatory Visit: Payer: Self-pay | Admitting: Radiology

## 2024-10-25 DIAGNOSIS — D709 Neutropenia, unspecified: Secondary | ICD-10-CM

## 2024-10-26 ENCOUNTER — Other Ambulatory Visit (HOSPITAL_COMMUNITY): Payer: Self-pay | Admitting: Student

## 2024-10-27 ENCOUNTER — Ambulatory Visit: Admitting: Radiology

## 2024-10-28 ENCOUNTER — Telehealth: Payer: Self-pay

## 2024-10-28 NOTE — Telephone Encounter (Signed)
 Patient did not go to previously scheduled bone marrow biopsy.  I have Mon 2/2 at 9:30a an arrive at 8:30a or Tues 2/3 at 8:30a an arrive at 7:30a. Would either of these work?  Kaitlin Wilson, I had a cancellation for tomorrow morning at 8:30a an arrive at 7:30a. Do you think she would want to come in the morning?.  Outbound call to patient who said she has walking pneumonia.  Also said she spoke with someone telling them she would not go for her bm bx.  Attempted to offer dates next week as noted above.  Patient responded she didn't even want to talk about rescheduling, says no matter what she's not coming if it's icing.  Patient also [place] she didn't even want to look at what appointments she has scheduled. I told her I'd call her next week when she's feeling better to further discuss; patient verbalized understanding.

## 2024-10-30 ENCOUNTER — Telehealth: Payer: Self-pay | Admitting: Oncology

## 2024-10-30 NOTE — Telephone Encounter (Signed)
 Pt called because her phone rang and it was the automated appt reminder for her appt on 2/6/. Pt stated she did not make this appt. I told her it was for a wk after her biopsy but it looks like the bx was cancelled.  I told pt I can transfer her to [place] (IR) to r/s the bx. Pt stated okay, call transferred.

## 2024-11-04 NOTE — Telephone Encounter (Signed)
 Patient missed bone marrow biopsy previously scheduled.  Per Clarita next available is wed Wed 2/11 at 8:30a an arrive at 7:30a. Outbound call to patient; said next Wed will not work. Requested next Fri 2/13 but per Clarita Dr Karalee has a large case that morning - he has a [place] an it will take most of the morning.  Offered Mon 2/16 patient refused stating she doesn't have her work schedule that far out but that would work either; patient requested Fri 2/20.  Patient scheduled for Fri 2/20 arrive 7:30am appt 8:30am; reviewed npo after midnight, will need a driver due to moderate sedation being involved and [facility]. Patient verbalized understanding. Reminded patient she will receive a call from [place] the day prior to review same instructions listed above.

## 2024-11-05 NOTE — Telephone Encounter (Signed)
 Alan at [facility] informed Kaitlin Wilson that they will be low on staff on Friday 2/20 and that they are unable to do her BMB that day and we will need to reschedule her. Next availability according to Kaitlin Wilson: I have the rest of that week at 8:30a an arrive at 7:30a 2/16-2/19 or the rest of the following week 2/23-2/27.  Outbound call to patient; patient agreed to Fri 2/27 arrive 7:30am, appointment 8:30am. Informed patient I will have scheduling reach out to patient to reschedule follow up w/ Dr. Melanee.  Patient verbalized understanding.

## 2024-11-06 ENCOUNTER — Inpatient Hospital Stay

## 2024-11-06 ENCOUNTER — Inpatient Hospital Stay: Admitting: Oncology

## 2024-11-20 ENCOUNTER — Ambulatory Visit

## 2024-11-27 ENCOUNTER — Ambulatory Visit: Admitting: Radiology

## 2024-11-27 ENCOUNTER — Inpatient Hospital Stay: Admitting: Oncology

## 2024-11-27 ENCOUNTER — Inpatient Hospital Stay

## 2024-12-02 ENCOUNTER — Inpatient Hospital Stay: Admitting: Oncology

## 2024-12-02 ENCOUNTER — Inpatient Hospital Stay
